# Patient Record
Sex: Female | Born: 1955 | Race: Black or African American | Hispanic: No | Marital: Single | Smoking: Never smoker
Health system: Southern US, Community
[De-identification: ages and names within clinical notes are randomized; demographics above are authoritative.]

## PROBLEM LIST (undated history)

## (undated) DIAGNOSIS — Z860101 Personal history of adenomatous and serrated colon polyps: Secondary | ICD-10-CM

## (undated) DIAGNOSIS — F419 Anxiety disorder, unspecified: Secondary | ICD-10-CM

## (undated) DIAGNOSIS — Z86010 Personal history of colonic polyps: Secondary | ICD-10-CM

## (undated) DIAGNOSIS — E049 Nontoxic goiter, unspecified: Secondary | ICD-10-CM

## (undated) DIAGNOSIS — R112 Nausea with vomiting, unspecified: Secondary | ICD-10-CM

## (undated) DIAGNOSIS — F329 Major depressive disorder, single episode, unspecified: Secondary | ICD-10-CM

## (undated) DIAGNOSIS — M4800 Spinal stenosis, site unspecified: Secondary | ICD-10-CM

## (undated) DIAGNOSIS — M1990 Unspecified osteoarthritis, unspecified site: Secondary | ICD-10-CM

## (undated) DIAGNOSIS — E785 Hyperlipidemia, unspecified: Secondary | ICD-10-CM

## (undated) DIAGNOSIS — I10 Essential (primary) hypertension: Secondary | ICD-10-CM

## (undated) DIAGNOSIS — K7689 Other specified diseases of liver: Secondary | ICD-10-CM

## (undated) DIAGNOSIS — F32A Depression, unspecified: Secondary | ICD-10-CM

## (undated) DIAGNOSIS — Z9889 Other specified postprocedural states: Secondary | ICD-10-CM

## (undated) DIAGNOSIS — K219 Gastro-esophageal reflux disease without esophagitis: Secondary | ICD-10-CM

## (undated) DIAGNOSIS — C801 Malignant (primary) neoplasm, unspecified: Secondary | ICD-10-CM

## (undated) HISTORY — DX: Spinal stenosis, site unspecified: M48.00

## (undated) HISTORY — DX: Personal history of adenomatous and serrated colon polyps: Z86.0101

## (undated) HISTORY — DX: Nontoxic goiter, unspecified: E04.9

## (undated) HISTORY — DX: Hyperlipidemia, unspecified: E78.5

## (undated) HISTORY — DX: Personal history of colonic polyps: Z86.010

## (undated) HISTORY — PX: OTHER SURGICAL HISTORY: SHX169

## (undated) HISTORY — DX: Other specified diseases of liver: K76.89

## (undated) HISTORY — PX: COLONOSCOPY: SHX174

## (undated) HISTORY — DX: Gastro-esophageal reflux disease without esophagitis: K21.9

## (undated) HISTORY — DX: Unspecified osteoarthritis, unspecified site: M19.90

---

## 1999-06-02 HISTORY — PX: TOTAL HIP ARTHROPLASTY: SHX124

## 2000-09-29 HISTORY — PX: ESOPHAGOGASTRODUODENOSCOPY: SHX1529

## 2001-01-07 HISTORY — PX: COLONOSCOPY: SHX174

## 2002-07-31 HISTORY — PX: KNEE SURGERY: SHX244

## 2003-06-02 DIAGNOSIS — C801 Malignant (primary) neoplasm, unspecified: Secondary | ICD-10-CM

## 2003-06-02 HISTORY — DX: Malignant (primary) neoplasm, unspecified: C80.1

## 2003-06-02 HISTORY — PX: ESOPHAGOGASTRODUODENOSCOPY: SHX1529

## 2003-12-14 HISTORY — PX: COLONOSCOPY W/ POLYPECTOMY: SHX1380

## 2004-12-09 ENCOUNTER — Ambulatory Visit: Payer: Self-pay | Admitting: General Practice

## 2009-11-21 ENCOUNTER — Ambulatory Visit (HOSPITAL_COMMUNITY): Admission: RE | Admit: 2009-11-21 | Discharge: 2009-11-21 | Payer: Self-pay | Admitting: Family Medicine

## 2010-07-02 ENCOUNTER — Other Ambulatory Visit (HOSPITAL_COMMUNITY): Payer: Self-pay | Admitting: *Deleted

## 2010-07-02 DIAGNOSIS — R1011 Right upper quadrant pain: Secondary | ICD-10-CM

## 2010-07-02 DIAGNOSIS — R1031 Right lower quadrant pain: Secondary | ICD-10-CM

## 2010-07-14 ENCOUNTER — Ambulatory Visit (HOSPITAL_COMMUNITY)
Admission: RE | Admit: 2010-07-14 | Discharge: 2010-07-14 | Disposition: A | Discharge status: Home / Self Care | Payer: Self-pay | Source: Ambulatory Visit | Attending: Family Medicine | Admitting: Family Medicine

## 2010-07-14 ENCOUNTER — Encounter (HOSPITAL_COMMUNITY): Payer: Self-pay

## 2010-07-14 DIAGNOSIS — M5137 Other intervertebral disc degeneration, lumbosacral region: Secondary | ICD-10-CM | POA: Insufficient documentation

## 2010-07-14 DIAGNOSIS — R1031 Right lower quadrant pain: Secondary | ICD-10-CM

## 2010-07-14 DIAGNOSIS — M51379 Other intervertebral disc degeneration, lumbosacral region without mention of lumbar back pain or lower extremity pain: Secondary | ICD-10-CM | POA: Insufficient documentation

## 2010-07-14 DIAGNOSIS — M48061 Spinal stenosis, lumbar region without neurogenic claudication: Secondary | ICD-10-CM | POA: Insufficient documentation

## 2010-07-14 DIAGNOSIS — R1011 Right upper quadrant pain: Secondary | ICD-10-CM | POA: Insufficient documentation

## 2010-07-14 DIAGNOSIS — R109 Unspecified abdominal pain: Secondary | ICD-10-CM | POA: Insufficient documentation

## 2010-07-14 DIAGNOSIS — Z96649 Presence of unspecified artificial hip joint: Secondary | ICD-10-CM | POA: Insufficient documentation

## 2010-07-14 MED ORDER — IOHEXOL 300 MG/ML  SOLN
100.0000 mL | Freq: Once | INTRAMUSCULAR | Status: AC | PRN
  Administered 2010-07-14: 100 mL via INTRAVENOUS

## 2010-09-15 ENCOUNTER — Other Ambulatory Visit (HOSPITAL_COMMUNITY): Payer: Self-pay | Admitting: Family Medicine

## 2010-09-15 DIAGNOSIS — R1031 Right lower quadrant pain: Secondary | ICD-10-CM

## 2010-09-15 DIAGNOSIS — R1011 Right upper quadrant pain: Secondary | ICD-10-CM

## 2010-09-30 ENCOUNTER — Other Ambulatory Visit (HOSPITAL_COMMUNITY): Payer: Self-pay | Admitting: Family Medicine

## 2010-09-30 DIAGNOSIS — Z139 Encounter for screening, unspecified: Secondary | ICD-10-CM

## 2010-10-20 ENCOUNTER — Ambulatory Visit (HOSPITAL_COMMUNITY)
Admission: RE | Admit: 2010-10-20 | Discharge: 2010-10-20 | Disposition: A | Discharge status: Home / Self Care | Payer: Self-pay | Source: Ambulatory Visit | Attending: Family Medicine | Admitting: Family Medicine

## 2010-10-20 DIAGNOSIS — Z139 Encounter for screening, unspecified: Secondary | ICD-10-CM

## 2011-06-02 HISTORY — PX: TOTAL HIP ARTHROPLASTY: SHX124

## 2011-07-11 LAB — COMPREHENSIVE METABOLIC PANEL
ALT: 24 U/L (ref 7–35)
Alkaline Phosphatase: 89 U/L
Calcium: 11.1 mg/dL
Creat: 0.72
TSH: 1.69 u[IU]/mL (ref 0.41–5.90)
Total Bilirubin: 1.4 mg/dL

## 2011-08-24 ENCOUNTER — Telehealth: Payer: Self-pay

## 2011-08-24 NOTE — Telephone Encounter (Signed)
Called pt to schedule appt for colonoscopy. ( Triaged by Ginger on Sat 08/22/2011). She said she is having problems with her stool. She said sometimes it will not come out. She also states that some stool comes out of her vagina. Scheduled for OV with Gerrit Halls, NP on 08/25/2011 at 11:30 AM.   Pt said she does not have insurance. She was given a paper at the event on Sat to call [employer] at [phone] to sign up for assistance. I spoke with my office manager, Ave Filter, who said it was OK to schedule now. Just advise pt to complete her paper work. Pt said she will do so.

## 2011-08-25 ENCOUNTER — Encounter: Payer: Self-pay | Admitting: Gastroenterology

## 2011-08-25 ENCOUNTER — Ambulatory Visit: Payer: Self-pay | Admitting: Gastroenterology

## 2011-08-25 ENCOUNTER — Ambulatory Visit (INDEPENDENT_AMBULATORY_CARE_PROVIDER_SITE_OTHER): Payer: Self-pay | Admitting: Gastroenterology

## 2011-08-25 ENCOUNTER — Other Ambulatory Visit: Payer: Self-pay

## 2011-08-25 VITALS — BP 120/76 | HR 69 | Temp 97.2°F | Ht 67.0 in | Wt 225.6 lb

## 2011-08-25 DIAGNOSIS — R1013 Epigastric pain: Secondary | ICD-10-CM | POA: Insufficient documentation

## 2011-08-25 DIAGNOSIS — K219 Gastro-esophageal reflux disease without esophagitis: Secondary | ICD-10-CM

## 2011-08-25 DIAGNOSIS — R6881 Early satiety: Secondary | ICD-10-CM | POA: Insufficient documentation

## 2011-08-25 DIAGNOSIS — Z86010 Personal history of colonic polyps: Secondary | ICD-10-CM

## 2011-08-25 DIAGNOSIS — R198 Other specified symptoms and signs involving the digestive system and abdomen: Secondary | ICD-10-CM | POA: Insufficient documentation

## 2011-08-25 NOTE — Progress Notes (Signed)
Faxed to PCP

## 2011-08-25 NOTE — Assessment & Plan Note (Signed)
H/O 1.5 cm size polyp at 25 cm with patchy white mucosa at the base, removed at time of TCS 2005. Do not have that particular pathology. 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 1 cm on a stalk. Due for surveillance colonoscopy at this time.   She c/o change in bowels with daily mushy stool, feels incomplete, intermittent brbpr, ?stool/air from vagina? No specific risk factors for development of fistula. Further evaluation at time of colonoscopy.   Retrieve copy of last labs done at PCP in 07/2011.

## 2011-08-25 NOTE — Progress Notes (Signed)
Addended by: Cherene Julian D on: 08/25/2011 02:37 PM   Modules accepted: Orders

## 2011-08-25 NOTE — Progress Notes (Signed)
Labs from [facility] were reviewed, done 07/07/2011. Total cholesterol 163, HDL 51, glucose 80, BUN 18, creatinine 0.72, sodium 143, potassium 4.1, albumin 4.6, calcium 11.1H, total bilirubin 1.4, alkaline phosphatase 89, AST 20, ALT 24, free T4-1.3, TSH 1.69, vitamin D, 25-OH total 31.   Would recommend rechecking serum calcium level to make sure not still elevated. If still elevated, she will need further testing.

## 2011-08-25 NOTE — Patient Instructions (Signed)
We have scheduled you for an upper endoscopy and colonoscopy. Please see separate instructions.  

## 2011-08-25 NOTE — Progress Notes (Signed)
LM for pt to call. Lab order faxed to [facility].  

## 2011-08-25 NOTE — Assessment & Plan Note (Signed)
C/O GERD, epigastric pain, early satiety. H/O large hh per patient. EGD in near future. Obtain lab work from PCP.

## 2011-08-25 NOTE — Progress Notes (Signed)
Primary Care Physician:  Trenton Founds, DO  Primary Gastroenterologist:  Jonette Eva, MD   Chief Complaint  Patient presents with  . Colonoscopy    HPI:  Ruth Hughes is a 56 y.o. female here to schedule colonoscopy. She was seen at our [facility] last week. She is due for surveillance colonoscopy given history of advanced polyps. She is currently uninsured and seeking disability, therefore came to our facility with hope of patient assistance.  She complains of change in stool over past couple of years. Stool texture like chocolate pudding. Has to continually wipe to get clean. Uses a lot of tissue paper. Takes over 30 minutes to have BM. Feels like never complete. Rare constipation but usually associated with brbpr.  Some urgency. She wears a pad because she soils her clothing. Sees stool on front of pad, feels gas pass in front and questions if coming from her vagina. Crampy abdominal pain and epigastric pain. C/O early satiety.  Chronic right sided flank pain/rib cage pain. Lots of tests since 2010, told it was musculoskeletal. Nausea. Heartburn, but doesn't take anything anymore. Heartburn twice per week. No swallowing issues. No weight loss.   Current Outpatient Prescriptions  Medication Sig Dispense Refill  . aspirin 81 MG tablet Take 81 mg by mouth daily.      . cetirizine (ZYRTEC) 10 MG tablet Take 10 mg by mouth daily.      . metoprolol (LOPRESSOR) 50 MG tablet Take 25 mg by mouth once. 1/2 of 25 mg daily      . NON FORMULARY Calcium 500 mg qd      . oxycodone (OXY-IR) 5 MG capsule Take 5 mg by mouth every 6 (six) hours as needed.      . simvastatin (ZOCOR) 20 MG tablet Take 20 mg by mouth every evening.      . triamcinolone ointment (KENALOG) 0.5 % Apply topically 2 (two) times daily.        Allergies as of 08/25/2011 - Review Complete 08/25/2011  Allergen Reaction Noted  . Bextra (valdecoxib) Nausea Only 08/24/2011  . Tramadol Nausea Only 08/24/2011     Past Medical History  Diagnosis Date  . GERD (gastroesophageal reflux disease)   . Hx of adenomatous colonic polyps   . Hyperlipidemia   . PVC (premature ventricular contraction)   . Goiter   . Chronic hip pain   . Chronic back pain     lumbar  . Osteoarthritis   . Hiatal hernia     Past Surgical History  Procedure Date  . Hip surgery 2001    right hip replacement  . Esophagogastroduodenoscopy 2005    hiatal hernia, GERD per patient  . Colonoscopy 12/14/2003    Dr. Patel-->1.5 cm size polyp with patchy whitish mucosa in sigmoid colon around base of polyp. Path unavailable.   . Knee surgery 03/04    right  . Colonoscopy 01/07/2001    hyperplastic polyp/tubulovillous adenoma with severe atypia  . Colonoscopy     per patient has additional one in 2006, 2007 and Flex sig in 2008. Told next colonoscopy in 2013.     Family History  Problem Relation Age of Onset  . Colon cancer Neg Hx   . Breast cancer Neg Hx     History   Social History  . Marital Status: Single    Spouse Name: N/A    Number of Children: 1  . Years of Education: N/A   Occupational History  . Geologist, engineering for  14 years        . 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 Smoking status: Never Smoker   . Smokeless tobacco: Not on file  . Alcohol Use: No  . Drug Use: No  . Sexually Active: Not on file   Other Topics Concern  . Not on file   Social History Narrative  . No narrative on file      ROS:  General: Negative for anorexia, weight loss, fever, chills, fatigue, weakness. Eyes: Negative for vision changes.  ENT: Negative for hoarseness, difficulty swallowing , nasal congestion. CV: Negative for chest pain, angina, palpitations, dyspnea on exertion, peripheral edema.  Respiratory: Negative for dyspnea at rest, dyspnea on exertion, cough, sputum, wheezing.  GI: See history of present illness. GU:  Negative for dysuria, hematuria, urinary incontinence, urinary frequency,  nocturnal urination.  MS: Chronic left hip, low back pain.  Derm: Negative for rash or itching.  Neuro: Negative for weakness, abnormal sensation, seizure, frequent headaches, memory loss, confusion.  Psych: Negative for anxiety, depression, suicidal ideation, hallucinations.  Endo: Negative for unusual weight change.  Heme: Negative for bruising or bleeding. Allergy: Negative for rash or hives.    Physical Examination:  BP 120/76  Pulse 69  Temp(Src) 97.2 F (36.2 C) (Temporal)  Ht 5\' 7"  (1.702 m)  Wt 225 lb 9.6 oz (102.331 kg)  BMI 35.33 kg/m2   General: Well-nourished, well-developed in no acute distress. Tearful. Head: Normocephalic, atraumatic.   Eyes: Conjunctiva pink, no icterus. Mouth: Oropharyngeal mucosa moist and pink , no lesions erythema or exudate. Neck: Supple without thyromegaly, masses, or lymphadenopathy.  Lungs: Clear to auscultation bilaterally.  Heart: Regular rate and rhythm, no murmurs rubs or gallops.  Abdomen: Bowel sounds are normal, epigastric tenderness, tenderness with palpation right lower ribcage margin, no CVA tenderness, nondistended, no hepatosplenomegaly or masses, no abdominal bruits or    hernia , no rebound or guarding.   Rectal: Defer to time of colonoscopy. Extremities: No lower extremity edema. No clubbing or deformities.  Neuro: Alert and oriented x 4 , grossly normal neurologically.  Skin: Warm and dry, no rash or jaundice.   Psych: Alert and cooperative, normal mood and affect.   Imaging Studies: CT A/P in 4//2012 unremarkable.

## 2011-08-26 ENCOUNTER — Telehealth: Payer: Self-pay | Admitting: Gastroenterology

## 2011-08-26 NOTE — Telephone Encounter (Signed)
Patient was returning DS call. Please call her back.

## 2011-08-27 NOTE — Telephone Encounter (Signed)
Pt will be going to have blood work done in the morning.

## 2011-08-28 ENCOUNTER — Other Ambulatory Visit: Payer: Self-pay | Admitting: Gastroenterology

## 2011-08-29 LAB — CALCIUM: Calcium: 10.6 mg/dL — ABNORMAL HIGH (ref 8.4–10.5)

## 2011-08-31 NOTE — Progress Notes (Signed)
Per Ginger, she called pt last week and informed to get the labs done. I called pt back. She has had it done already.

## 2011-08-31 NOTE — Progress Notes (Signed)
Pt informed

## 2011-08-31 NOTE — Progress Notes (Signed)
LMOM to call.

## 2011-09-02 ENCOUNTER — Encounter: Payer: Self-pay | Admitting: Gastroenterology

## 2011-09-02 NOTE — Progress Notes (Signed)
Quick Note:  Almost normal. F/U with PCP for repeat calcium level in few months. ______

## 2011-09-02 NOTE — Progress Notes (Signed)
Additional path received. Updated PSH.

## 2011-09-03 ENCOUNTER — Encounter (HOSPITAL_COMMUNITY)
Admission: RE | Disposition: A | Discharge status: Home / Self Care | Payer: Self-pay | Source: Ambulatory Visit | Attending: Gastroenterology

## 2011-09-03 ENCOUNTER — Ambulatory Visit (HOSPITAL_COMMUNITY)
Admission: RE | Admit: 2011-09-03 | Discharge: 2011-09-03 | Disposition: A | Discharge status: Home / Self Care | Payer: Self-pay | Source: Ambulatory Visit | Attending: Gastroenterology | Admitting: Gastroenterology

## 2011-09-03 ENCOUNTER — Encounter (HOSPITAL_COMMUNITY): Payer: Self-pay | Admitting: *Deleted

## 2011-09-03 DIAGNOSIS — Z860100 Personal history of colon polyps, unspecified: Secondary | ICD-10-CM | POA: Insufficient documentation

## 2011-09-03 DIAGNOSIS — R1013 Epigastric pain: Secondary | ICD-10-CM

## 2011-09-03 DIAGNOSIS — R109 Unspecified abdominal pain: Secondary | ICD-10-CM | POA: Insufficient documentation

## 2011-09-03 DIAGNOSIS — K219 Gastro-esophageal reflux disease without esophagitis: Secondary | ICD-10-CM

## 2011-09-03 DIAGNOSIS — R6881 Early satiety: Secondary | ICD-10-CM

## 2011-09-03 DIAGNOSIS — R197 Diarrhea, unspecified: Secondary | ICD-10-CM | POA: Insufficient documentation

## 2011-09-03 DIAGNOSIS — K2970 Gastritis, unspecified, without bleeding: Secondary | ICD-10-CM

## 2011-09-03 DIAGNOSIS — K5730 Diverticulosis of large intestine without perforation or abscess without bleeding: Secondary | ICD-10-CM

## 2011-09-03 DIAGNOSIS — K648 Other hemorrhoids: Secondary | ICD-10-CM | POA: Insufficient documentation

## 2011-09-03 DIAGNOSIS — D126 Benign neoplasm of colon, unspecified: Secondary | ICD-10-CM | POA: Insufficient documentation

## 2011-09-03 DIAGNOSIS — R198 Other specified symptoms and signs involving the digestive system and abdomen: Secondary | ICD-10-CM

## 2011-09-03 DIAGNOSIS — K2940 Chronic atrophic gastritis without bleeding: Secondary | ICD-10-CM | POA: Insufficient documentation

## 2011-09-03 DIAGNOSIS — K2990 Gastroduodenitis, unspecified, without bleeding: Secondary | ICD-10-CM

## 2011-09-03 HISTORY — PX: ESOPHAGOGASTRODUODENOSCOPY: SHX1529

## 2011-09-03 HISTORY — PX: COLONOSCOPY: SHX174

## 2011-09-03 SURGERY — COLONOSCOPY WITH ESOPHAGOGASTRODUODENOSCOPY (EGD)
Anesthesia: Moderate Sedation

## 2011-09-03 MED ORDER — OMEPRAZOLE 20 MG PO CPDR: DELAYED_RELEASE_CAPSULE | ORAL | Status: DC

## 2011-09-03 MED ORDER — MIDAZOLAM HCL 5 MG/5ML IJ SOLN
INTRAMUSCULAR | Status: AC
  Filled 2011-09-03: qty 10

## 2011-09-03 MED ORDER — MIDAZOLAM HCL 5 MG/5ML IJ SOLN
INTRAMUSCULAR | Status: DC | PRN
  Administered 2011-09-03 (×2): 1 mg via INTRAVENOUS
  Administered 2011-09-03 (×2): 2 mg via INTRAVENOUS
  Administered 2011-09-03: 1 mg via INTRAVENOUS

## 2011-09-03 MED ORDER — MEPERIDINE HCL 50 MG/ML IJ SOLN
INTRAMUSCULAR | Status: AC
  Filled 2011-09-03: qty 2

## 2011-09-03 MED ORDER — SODIUM CHLORIDE 0.45 % IV SOLN
Freq: Once | INTRAVENOUS | Status: AC
  Administered 2011-09-03: 10:00:00 via INTRAVENOUS

## 2011-09-03 MED ORDER — MEPERIDINE HCL 100 MG/ML IJ SOLN
INTRAMUSCULAR | Status: AC
  Filled 2011-09-03: qty 2

## 2011-09-03 MED ORDER — STERILE WATER FOR IRRIGATION IR SOLN
Status: DC | PRN
  Administered 2011-09-03: 11:00:00

## 2011-09-03 MED ORDER — MEPERIDINE HCL 100 MG/ML IJ SOLN
INTRAMUSCULAR | Status: DC | PRN
  Administered 2011-09-03 (×2): 25 mg via INTRAVENOUS
  Administered 2011-09-03: 50 mg via INTRAVENOUS

## 2011-09-03 NOTE — Discharge Instructions (Signed)
YOUR UPPER ENDOSCOPY SHOWED GASTRITIS DUE TO ASPIRIN USE. YOU HAD ONE POLYP REMOVED. NO OBVIOUS SOURCE FOR YOUR LOOSE STOOLS WERE IDENTIFIED. THEY ARE MOST LIKELY COMING FROM WHAT YOUR ARE EATING. You have internal hemorrhoids, WHICH CAN CAUSE RECTAL BLEEDING. I biopsied your stomach, SMALL BOWEL, & colon.  START PRILOSEC 30 MINUTES PRIOR TO YOUR FIRST MEAL.  AVOID ITEMS THAT TRIGGER GASTRITIS. SEE INFO BELOW.  FOLLOW A HIGH FIBER/LOW FAT/LACTOSE FREE DIET. AVOID ITEMS THAT CAUSE BLOATING. SEE INFO BELOW.  LOSE 10 TO 20 LBS. IT WILL DECREASE YOUR RISK FOR COLON CANCER.  YOUR BIOPSY RESULTS WILL BE BACK IN 7-10 DAYS.  FOLLOW UP IN 4 MOS.   ENDOSCOPY Care After Read the instructions outlined below and refer to this sheet in the next week. These discharge instructions provide you with general information on caring for yourself after you leave the hospital. While your treatment has been planned according to the most current medical practices available, unavoidable complications occasionally occur. If you have any problems or questions after discharge, call DR. Zooey Schreurs, [phone].  ACTIVITY  You may resume your regular activity, but move at a slower pace for the next 24 hours.   Take frequent rest periods for the next 24 hours.   Walking will help get rid of the air and reduce the bloated feeling in your belly (abdomen).   No driving for 24 hours (because of the medicine (anesthesia) used during the test).   You may shower.   Do not sign any important legal documents or operate any machinery for 24 hours (because of the anesthesia used during the test).    NUTRITION  Drink plenty of fluids.   You may resume your normal diet as instructed by your doctor.   Begin with a light meal and progress to your normal diet. Heavy or fried foods are harder to digest and may make you feel sick to your stomach (nauseated).   Avoid alcoholic beverages for 24 hours or as instructed.     MEDICATIONS  You may resume your normal medications.   WHAT YOU CAN EXPECT TODAY  Some feelings of bloating in the abdomen.   Passage of more gas than usual.   Spotting of blood in your stool or on the toilet paper  .  IF YOU HAD POLYPS REMOVED DURING THE ENDOSCOPY:  Eat a soft diet IF YOU HAVE NAUSEA, BLOATING, ABDOMINAL PAIN, OR VOMITING.    FINDING OUT THE RESULTS OF YOUR TEST Not all test results are available during your visit. DR. Darrick Penna WILL CALL YOU WITHIN 7 DAYS OF YOUR PROCEDUE WITH YOUR RESULTS. Do not assume everything is normal if you have not heard from DR. Hilery Wintle IN ONE WEEK, CALL HER OFFICE AT [phone].  SEEK IMMEDIATE MEDICAL ATTENTION AND CALL THE OFFICE: [phone] IF:  You have more than a spotting of blood in your stool.   Your belly is swollen (abdominal distention).   You are nauseated or vomiting.   You have a temperature over 101F.   You have abdominal pain or discomfort that is severe or gets worse throughout the day.  Gastritis  Gastritis is an inflammation (the body's way of reacting to injury and/or infection) of the stomach. It is often caused by viral or bacterial (germ) infections. It can also be caused BY ASPIRIN, BC/GOODY POWDER'S, (IBUPROFEN) MOTRIN, OR ALEVE (NAPROXEN), chemicals (including alcohol), SPICY FOODS, and medications. This illness may be associated with generalized malaise (feeling tired, not well), UPPER ABDOMINAL STOMACH cramps, and fever.  One common bacterial cause of gastritis is an organism known as H. Pylori. This can be treated with antibiotics.    Polyps, Colon  A polyp is extra tissue that grows inside your body. Colon polyps grow in the large intestine. The large intestine, also called the colon, is part of your digestive system. It is a long, hollow tube at the end of your digestive tract where your body makes and stores stool. Most polyps are not dangerous. They are benign. This means they are not  cancerous. But over time, some types of polyps can turn into cancer. Polyps that are smaller than a pea are usually not harmful. But larger polyps could someday become or may already be cancerous. To be safe, doctors remove all polyps and test them.   WHO GETS POLYPS? Anyone can get polyps, but certain people are more likely than others. You may have a greater chance of getting polyps if:  You are over 50.   You have had polyps before.   Someone in your family has had polyps.   Someone in your family has had cancer of the large intestine.   Find out if someone in your family has had polyps. You may also be more likely to get polyps if you:   Eat a lot of fatty foods   Smoke   Drink alcohol   Do not exercise  Eat too much   TREATMENT  The caregiver will remove the polyp during sigmoidoscopy or colonoscopy.  PREVENTION There is not one sure way to prevent polyps. You might be able to lower your risk of getting them if you:  Eat more fruits and vegetables and less fatty food.   Do not smoke.   Avoid alcohol.   Exercise every day.   Lose weight if you are overweight.   Eating more calcium and folate can also lower your risk of getting polyps. Some foods that are rich in calcium are milk, cheese, and broccoli. Some foods that are rich in folate are chickpeas, kidney beans, and spinach.    Lactose Free Diet Lactose is a carbohydrate that is found mainly in milk and milk products, as well as in foods with added milk or whey. Lactose must be digested by the enzyme in order to be used by the body. Lactose intolerance occurs when there is a shortage of lactase. When your body is not able to digest lactose, you may feel sick to your stomach (nausea), bloating, cramping, gas and diarrhea.  There are many dairy products that may be tolerated better than milk by some people:  The use of cultured dairy products such as yogurt, buttermilk, cottage cheese, and sweet acidophilus milk  (Kefir) for lactase-deficient individuals is usually well tolerated. This is because the healthy bacteria help digest lactose.   Lactose-hydrolyzed milk (Lactaid) contains 40-90% less lactose than milk and may also be well tolerated.    SPECIAL NOTES  Lactose is a carbohydrates. The major food source is dairy products. Reading food labels is important. Many products contain lactose even when they are not made from milk. Look for the following words: whey, milk solids, dry milk solids, nonfat dry milk powder. Typical sources of lactose other than dairy products include breads, candies, cold cuts, prepared and processed foods, and commercial sauces and gravies.   All foods must be prepared without milk, cream, or other dairy foods.   Soy milk and lactose-free supplements (LACTASE) may be used as an alternative to milk.   FOOD GROUP  ALLOWED/RECOMMENDED AVOID/USE SPARINGLY  BREADS / STARCHES 4 servings or more* Breads and rolls made without milk. [place], [place], or [place] bread. Breads and rolls that contain milk. Prepared mixes such as muffins, biscuits, waffles, pancakes. Sweet rolls, donuts, [place] toast (if made with milk or lactose).  Crackers: Soda crackers, graham crackers. Any crackers prepared without lactose. Zwieback crackers, corn curls, or any that contain lactose.  Cereals: Cooked or dry cereals prepared without lactose (read labels). Cooked or dry cereals prepared with lactose (read labels). Total, Cocoa Krispies. Special K.  Potatoes / Pasta / Rice: Any prepared without milk or lactose. Popcorn. Instant potatoes, frozen [place] fries, scalloped or au gratin potatoes.  VEGETABLES 2 servings or more Fresh, frozen, and canned vegetables. Creamed or breaded vegetables. Vegetables in a cheese sauce or with lactose-containing margarines.  FRUIT 2 servings or more All fresh, canned, or frozen fruits that are not processed with lactose. Any canned or frozen fruits processed with  lactose.  MEAT & SUBSTITUTES 2 servings or more (4 to 6 oz. total per day) Plain beef, chicken, fish, [place], lamb, veal, pork, or ham. Kosher prepared meat products. Strained or junior meats that do not contain milk. Eggs, soy meat substitutes, nuts. Scrambled eggs, omelets, and souffles that contain milk. Creamed or breaded meat, fish, or fowl. Sausage products such as wieners, liver sausage, or cold cuts that contain milk solids. Cheese, cottage cheese, or cheese spreads.  MILK None. (See "BEVERAGES" for milk substitutes. See "DESSERTS" for ice cream and frozen desserts.) Milk (whole, 2%, skim, or chocolate). Evaporated, powdered, or condensed milk; malted milk.  SOUPS & COMBINATION FOODS Bouillon, broth, vegetable soups, clear soups, consomms. Homemade soups made with allowed ingredients. Combination or prepared foods that do not contain milk or milk products (read labels). Cream soups, chowders, commercially prepared soups containing lactose. Macaroni and cheese, pizza. Combination or prepared foods that contain milk or milk products.  DESSERTS & SWEETS In moderation Water and fruit ices; gelatin; angel food cake. Homemade cookies, pies, or cakes made from allowed ingredients. Pudding (if made with water or a milk substitute). Lactose-free tofu desserts. Sugar, honey, corn syrup, jam, jelly; marmalade; molasses (beet sugar); Pure sugar candy; marshmallows. Ice cream, ice milk, sherbet, custard, pudding, frozen yogurt. Commercial cake and cookie mixes. Desserts that contain chocolate. Pie crust made with milk-containing margarine; reduced-calorie desserts made with a sugar substitute that contains lactose. Toffee, peppermint, butterscotch, chocolate, caramels.  FATS & OILS In moderation Butter (as tolerated; contains very small amounts of lactose). Margarines and dressings that do not contain milk, Vegetable oils, shortening, Miracle Whip, mayonnaise, nondairy cream & whipped toppings without lactose  or milk solids added (examples: Coffee Rich, Carnation Coffeemate, Rich's Whipped Topping, PolyRich). Tomasa Blase. Margarines and salad dressings containing milk; cream, cream cheese; peanut butter with added milk solids, sour cream, chip dips, made with sour cream.  BEVERAGES Carbonated drinks; tea; coffee and freeze-dried coffee; some instant coffees (check labels). Fruit drinks; fruit and vegetable juice; Rice or Soy milk. Ovaltine, hot chocolate. Some cocoas; some instant coffees; instant iced teas; powdered fruit drinks (read labels).   CONDIMENTS / MISCELLANEOUS Soy sauce, carob powder, olives, gravy made with water, baker's cocoa, pickles, pure seasonings and spices, wine, pure monosodium glutamate, catsup, mustard. Some chewing gums, chocolate, some cocoas. Certain antibiotics and vitamin / mineral preparations. Spice blends if they contain milk products. MSG extender. Artificial sweeteners that contain lactose such as Equal (Nutra-Sweet) and Sweet 'n Low. Some nondairy creamers (read labels).   SAMPLE MENU*  Breakfast   Orange Juice.  Banana.   Bran flakes.   Nondairy Creamer.  [place]ienna Bread (toasted).   Butter or milk-free margarine.   Coffee or tea.    Noon Meal   Chicken Breast.  Rice.   Green beans.   Butter or milk-free margarine.  Fresh melon.   Coffee or tea.    Evening Meal   Roast Beef.  Baked potato.   Butter or milk-free margarine.   Broccoli.   Lettuce salad with vinegar and oil dressing.  [employer].   Coffee or tea.      HIGH FIBER DIET A high-fiber diet changes your normal diet to include more whole grains, legumes, fruits, and vegetables. Changes in the diet involve replacing refined carbohydrates with unrefined foods. The calorie level of the diet is essentially unchanged. The Dietary Reference Intake (recommended amount) for adult males is 38 grams per day. For adult females, it is 25 grams per day. Pregnant and lactating women  should consume 28 grams of fiber per day. Fiber is the intact part of a plant that is not broken down during digestion. Functional fiber is fiber that has been isolated from the plant to provide a beneficial effect in the body. PURPOSE  Increase stool bulk.   Ease and regulate bowel movements.   Lower cholesterol.  INDICATIONS THAT YOU NEED MORE FIBER  Constipation and hemorrhoids.   Uncomplicated diverticulosis (intestine condition) and irritable bowel syndrome.   Weight management.   As a protective measure against hardening of the arteries (atherosclerosis), diabetes, and cancer.   GUIDELINES FOR INCREASING FIBER IN THE DIET  Start adding fiber to the diet slowly. A gradual increase of about 5 more grams (2 slices of whole-wheat bread, 2 servings of most fruits or vegetables, or 1 bowl of high-fiber cereal) per day is best. Too rapid an increase in fiber may result in constipation, flatulence, and bloating.   Drink enough water and fluids to keep your urine clear or pale yellow. Water, juice, or caffeine-free drinks are recommended. Not drinking enough fluid may cause constipation.   Eat a variety of high-fiber foods rather than one type of fiber.   Try to increase your intake of fiber through using high-fiber foods rather than fiber pills or supplements that contain small amounts of fiber.   The goal is to change the types of food eaten. Do not supplement your present diet with high-fiber foods, but replace foods in your present diet.  INCLUDE A VARIETY OF FIBER SOURCES  Replace refined and processed grains with whole grains, canned fruits with fresh fruits, and incorporate other fiber sources. White rice, white breads, and most bakery goods contain little or no fiber.   Brown whole-grain rice, buckwheat oats, and many fruits and vegetables are all good sources of fiber. These include: broccoli, Brussels sprouts, cabbage, cauliflower, beets, sweet potatoes, white potatoes (skin  on), carrots, tomatoes, eggplant, squash, berries, fresh fruits, and dried fruits.   Cereals appear to be the richest source of fiber. Cereal fiber is found in whole grains and bran. Bran is the fiber-rich outer coat of cereal grain, which is largely removed in refining. In whole-grain cereals, the bran remains. In breakfast cereals, the largest amount of fiber is found in those with "bran" in their names. The fiber content is sometimes indicated on the label.   You may need to include additional fruits and vegetables each day.   In baking, for 1 cup white flour, you may use the following  substitutions:   1 cup whole-wheat flour minus 2 tablespoons.   1/2 cup white flour plus 1/2 cup whole-wheat flour.   Low-Fat Diet BREADS, CEREALS, PASTA, RICE, DRIED PEAS, AND BEANS These products are high in carbohydrates and most are low in fat. Therefore, they can be increased in the diet as substitutes for fatty foods. They too, however, contain calories and should not be eaten in excess. Cereals can be eaten for snacks as well as for breakfast.  Include foods that contain fiber (fruits, vegetables, whole grains, and legumes). Research shows that fiber may lower blood cholesterol levels, especially the water-soluble fiber found in fruits, vegetables, oat products, and legumes. FRUITS AND VEGETABLES It is good to eat fruits and vegetables. Besides being sources of fiber, both are rich in vitamins and some minerals. They help you get the daily allowances of these nutrients. Fruits and vegetables can be used for snacks and desserts. MEATS Limit lean meat, chicken, [place], and fish to no more than 6 ounces per day. Beef, Pork, and Lamb Use lean cuts of beef, pork, and lamb. Lean cuts include:  Extra-lean ground beef.  Arm roast.  Sirloin tip.  Center-cut ham.  Round steak.  Loin chops.  Rump roast.  Tenderloin.  Trim all fat off the outside of meats before cooking. It is not necessary to severely  decrease the intake of red meat, but lean choices should be made. Lean meat is rich in protein and contains a highly absorbable form of iron. Premenopausal women, in particular, should avoid reducing lean red meat because this could increase the risk for low red blood cells (iron-deficiency anemia). The organ meats, such as liver, sweetbreads, kidneys, and brain are very rich in cholesterol. They should be limited. Chicken and [place] These are good sources of protein. The fat of poultry can be reduced by removing the skin and underlying fat layers before cooking. Chicken and [place] can be substituted for lean red meat in the diet. Poultry should not be fried or covered with high-fat sauces. Fish and Shellfish Fish is a good source of protein. Shellfish contain cholesterol, but they usually are low in saturated fatty acids. The preparation of fish is important. Like chicken and [place], they should not be fried or covered with high-fat sauces. EGGS Egg whites contain no fat or cholesterol. They can be eaten often. Try 1 to 2 egg whites instead of whole eggs in recipes or use egg substitutes that do not contain yolk. MILK AND DAIRY PRODUCTS Use skim or 1% milk instead of 2% or whole milk. Decrease whole milk, natural, and processed cheeses. Use nonfat or low-fat (2%) cottage cheese or low-fat cheeses made from vegetable oils. Choose nonfat or low-fat (1 to 2%) yogurt. Experiment with evaporated skim milk in recipes that call for heavy cream. Substitute low-fat yogurt or low-fat cottage cheese for sour cream in dips and salad dressings. Have at least 2 servings of low-fat dairy products, such as 2 glasses of skim (or 1%) milk each day to help get your daily calcium intake.  FATS AND OILS Reduce the total intake of fats, especially saturated fat. Butterfat, lard, and beef fats are high in saturated fat and cholesterol. These should be avoided as much as possible. Vegetable fats do not contain cholesterol, but  certain vegetable fats, such as coconut oil, palm oil, and palm kernel oil are very high in saturated fats. These should be limited. These fats are often used in [employer], processed foods, popcorn, oils, and nondairy  creamers. Vegetable shortenings and some peanut butters contain hydrogenated oils, which are also saturated fats. Read the labels on these foods and check for saturated vegetable oils. Unsaturated vegetable oils and fats do not raise blood cholesterol. However, they should be limited because they are fats and are high in calories. Total fat should still be limited to 30% of your daily caloric intake. Desirable liquid vegetable oils are corn oil, cottonseed oil, olive oil, canola oil, safflower oil, soybean oil, and sunflower oil. Peanut oil is not as good, but small amounts are acceptable. Buy a heart-healthy tub margarine that has no partially hydrogenated oils in the ingredients. Mayonnaise and salad dressings often are made from unsaturated fats, but they should also be limited because of their high calorie and fat content. Seeds, nuts, peanut butter, olives, and avocados are high in fat, but the fat is mainly the unsaturated type. These foods should be limited mainly to avoid excess calories and fat. OTHER EATING TIPS Snacks  Most sweets should be limited as snacks. They tend to be rich in calories and fats, and their caloric content outweighs their nutritional value. Some good choices in snacks are graham crackers, melba toast, soda crackers, bagels (no egg), English muffins, fruits, and vegetables. These snacks are preferable to snack crackers, [place] fries, and chips. Popcorn should be air-popped or cooked in small amounts of liquid vegetable oil. Desserts Eat fruit, low-fat yogurt, and fruit ices. AVOID pastries, cake, and cookies. Sherbet, angel food cake, gelatin dessert, frozen low-fat yogurt, or other frozen products that do not contain saturated fat (pure fruit juice bars, frozen  ice pops) are also acceptable.  COOKING METHODS Choose those methods that use little or no fat. They include: Poaching.  Braising.  Steaming.  Grilling.  Baking.  Stir-frying.  Broiling.  Microwaving.  Foods can be cooked in a nonstick pan without added fat, or use a nonfat cooking spray in regular cookware. Limit fried foods and avoid frying in saturated fat. Add moisture to lean meats by using water, broth, cooking wines, and other nonfat or low-fat sauces along with the cooking methods mentioned above. Soups and stews should be chilled after cooking. The fat that forms on top after a few hours in the refrigerator should be skimmed off. When preparing meals, avoid using excess salt. Salt can contribute to raising blood pressure in some people. EATING AWAY FROM HOME Order entres, potatoes, and vegetables without sauces or butter. When meat exceeds the size of a deck of cards (3 to 4 ounces), the rest can be taken home for another meal. Choose vegetable or fruit salads and ask for low-calorie salad dressings to be served on the side. Use dressings sparingly. Limit high-fat toppings, such as bacon, crumbled eggs, cheese, sunflower seeds, and olives. Ask for heart-healthy tub margarine instead of butter.  Hemorrhoids Hemorrhoids are dilated (enlarged) veins around the rectum. Sometimes clots will form in the veins. This makes them swollen and painful. These are called thrombosed hemorrhoids. Causes of hemorrhoids include:  Constipation.   Straining to have a bowel movement.   HEAVY LIFTING HOME CARE INSTRUCTIONS  Eat a well balanced diet and drink 6 to 8 glasses of water every day to avoid constipation. You may also use a bulk laxative.   Avoid straining to have bowel movements.   Keep anal area dry and clean.   Do not use a donut shaped pillow or sit on the toilet for long periods. This increases blood pooling and pain.   Move your  bowels when your body has the urge; this will  require less straining and will decrease pain and pressure.

## 2011-09-03 NOTE — H&P (View-Only) (Signed)
Primary Care Physician:  Trenton Founds, DO  Primary Gastroenterologist:  Jonette Eva, MD   Chief Complaint  Patient presents with  . Colonoscopy    HPI:  Ruth Hughes is a 56 y.o. female here to schedule colonoscopy. She was seen at our [facility] last week. She is due for surveillance colonoscopy given history of advanced polyps. She is currently uninsured and seeking disability, therefore came to our facility with hope of patient assistance.  She complains of change in stool over past couple of years. Stool texture like chocolate pudding. Has to continually wipe to get clean. Uses a lot of tissue paper. Takes over 30 minutes to have BM. Feels like never complete. Rare constipation but usually associated with brbpr.  Some urgency. She wears a pad because she soils her clothing. Sees stool on front of pad, feels gas pass in front and questions if coming from her vagina. Crampy abdominal pain and epigastric pain. C/O early satiety.  Chronic right sided flank pain/rib cage pain. Lots of tests since 2010, told it was musculoskeletal. Nausea. Heartburn, but doesn't take anything anymore. Heartburn twice per week. No swallowing issues. No weight loss.   Current Outpatient Prescriptions  Medication Sig Dispense Refill  . aspirin 81 MG tablet Take 81 mg by mouth daily.      . cetirizine (ZYRTEC) 10 MG tablet Take 10 mg by mouth daily.      . metoprolol (LOPRESSOR) 50 MG tablet Take 25 mg by mouth once. 1/2 of 25 mg daily      . NON FORMULARY Calcium 500 mg qd      . oxycodone (OXY-IR) 5 MG capsule Take 5 mg by mouth every 6 (six) hours as needed.      . simvastatin (ZOCOR) 20 MG tablet Take 20 mg by mouth every evening.      . triamcinolone ointment (KENALOG) 0.5 % Apply topically 2 (two) times daily.        Allergies as of 08/25/2011 - Review Complete 08/25/2011  Allergen Reaction Noted  . Bextra (valdecoxib) Nausea Only 08/24/2011  . Tramadol Nausea Only 08/24/2011     Past Medical History  Diagnosis Date  . GERD (gastroesophageal reflux disease)   . Hx of adenomatous colonic polyps   . Hyperlipidemia   . PVC (premature ventricular contraction)   . Goiter   . Chronic hip pain   . Chronic back pain     lumbar  . Osteoarthritis   . Hiatal hernia     Past Surgical History  Procedure Date  . Hip surgery 2001    right hip replacement  . Esophagogastroduodenoscopy 2005    hiatal hernia, GERD per patient  . Colonoscopy 12/14/2003    Dr. Patel-->1.5 cm size polyp with patchy whitish mucosa in sigmoid colon around base of polyp. Path unavailable.   . Knee surgery 03/04    right  . Colonoscopy 01/07/2001    hyperplastic polyp/tubulovillous adenoma with severe atypia  . Colonoscopy     per patient has additional one in 2006, 2007 and Flex sig in 2008. Told next colonoscopy in 2013.     Family History  Problem Relation Age of Onset  . Colon cancer Neg Hx   . Breast cancer Neg Hx     History   Social History  . Marital Status: Single    Spouse Name: N/A    Number of Children: 1  . Years of Education: N/A   Occupational History  . Geologist, engineering for  14 years        . 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 Smoking status: Never Smoker   . Smokeless tobacco: Not on file  . Alcohol Use: No  . Drug Use: No  . Sexually Active: Not on file   Other Topics Concern  . Not on file   Social History Narrative  . No narrative on file      ROS:  General: Negative for anorexia, weight loss, fever, chills, fatigue, weakness. Eyes: Negative for vision changes.  ENT: Negative for hoarseness, difficulty swallowing , nasal congestion. CV: Negative for chest pain, angina, palpitations, dyspnea on exertion, peripheral edema.  Respiratory: Negative for dyspnea at rest, dyspnea on exertion, cough, sputum, wheezing.  GI: See history of present illness. GU:  Negative for dysuria, hematuria, urinary incontinence, urinary frequency,  nocturnal urination.  MS: Chronic left hip, low back pain.  Derm: Negative for rash or itching.  Neuro: Negative for weakness, abnormal sensation, seizure, frequent headaches, memory loss, confusion.  Psych: Negative for anxiety, depression, suicidal ideation, hallucinations.  Endo: Negative for unusual weight change.  Heme: Negative for bruising or bleeding. Allergy: Negative for rash or hives.    Physical Examination:  BP 120/76  Pulse 69  Temp(Src) 97.2 F (36.2 C) (Temporal)  Ht 5\' 7"  (1.702 m)  Wt 225 lb 9.6 oz (102.331 kg)  BMI 35.33 kg/m2   General: Well-nourished, well-developed in no acute distress. Tearful. Head: Normocephalic, atraumatic.   Eyes: Conjunctiva pink, no icterus. Mouth: Oropharyngeal mucosa moist and pink , no lesions erythema or exudate. Neck: Supple without thyromegaly, masses, or lymphadenopathy.  Lungs: Clear to auscultation bilaterally.  Heart: Regular rate and rhythm, no murmurs rubs or gallops.  Abdomen: Bowel sounds are normal, epigastric tenderness, tenderness with palpation right lower ribcage margin, no CVA tenderness, nondistended, no hepatosplenomegaly or masses, no abdominal bruits or    hernia , no rebound or guarding.   Rectal: Defer to time of colonoscopy. Extremities: No lower extremity edema. No clubbing or deformities.  Neuro: Alert and oriented x 4 , grossly normal neurologically.  Skin: Warm and dry, no rash or jaundice.   Psych: Alert and cooperative, normal mood and affect.   Imaging Studies: CT A/P in 4//2012 unremarkable.

## 2011-09-03 NOTE — Progress Notes (Signed)
Quick Note:  Pt's daughter Alcario Drought, said pt is at the hospital having a procedure. Mailed letter for her to follow up with PCP on the Calcium in a few months. ______

## 2011-09-03 NOTE — Interval H&P Note (Signed)
History and Physical Interval Note:  09/03/2011 10:33 AM  Ruth Hughes  has presented today for surgery, with the diagnosis of ABD PAIN CHANGE IN BOWELS, GERD  The various methods of treatment have been discussed with the patient and family. After consideration of risks, benefits and other options for treatment, the patient has consented to  Procedure(s) (LRB): COLONOSCOPY WITH ESOPHAGOGASTRODUODENOSCOPY (EGD) (N/A) as a surgical intervention .  The patients' history has been reviewed, patient examined, no change in status, stable for surgery.  I have reviewed the patients' chart and labs.  Questions were answered to the patient's satisfaction.     [employer]

## 2011-09-03 NOTE — Op Note (Addendum)
[facility] [address] [place], [place]  [address]  COLONOSCOPY PROCEDURE REPORT  PATIENT:  Ruth Hughes, Ruth Hughes  MR#:  [id number] BIRTHDATE:  09-26-1955, 56 yrs. old  GENDER:  female  ENDOSCOPIST:  Jonette Eva, MD REF. BY:  DR. Lewis Moccasin, ANITA ASSISTANT:  PROCEDURE DATE:  09/03/2011 PROCEDURE:  ILEOColonoscopy with COLD FORCEPS BIOPSY/snare polypectomy, SPOT TATTOO, EGD WITH BIOPSY  INDICATIONS:  PERSONAL HX: POLYPS MORE FREQUENT LOOSE STOOLS, & ABDOMINAL PAIN  MEDICATIONS:   Demerol 100 mg IV, Versed 7 mg IV  DESCRIPTION OF PROCEDURE:    Physical exam was performed. Informed consent was obtained from the patient after explaining the benefits, risks, and alternatives to procedure.  The patient was connected to monitor and placed in left lateral position. Continuous oxygen was provided by nasal cannula and IV medicine administered through an indwelling cannula.  After administration of sedation and rectal exam, the patient's rectum was intubated and the EC-3890li ([id number]) and EG-2990i ([id number]) colonoscope was advanced under direct visualization to the ILEUM. The scope was removed slowly by carefully examining the color, texture, anatomy, and integrity mucosa on the way out.  After  THE COLONOSCOPY, the patient's rectum was intubated and the EC-3890li ([id number]) and EG-2990i ([id number]) colonoscope was advanced under direct visualization to the SECOND PORTION OF TH DUODENUM. The scope was removed slowly by carefully examining the color, texture, anatomy, and integrity mucosa on the way out.  The patient was recovered in endoscopy and discharged home in satisfactory condition. <<PROCEDUREIMAGES>>  FINDINGS:  There wAS ONE 8 MM SESSILE polyp identified and removed. in the descending colon VIA SNARE CAUTERY. BASE OF POLYP SITE TATTOOED WITH SPOT. RANDOM BIOPSIES OBTAINED VIA COLD FORCEPS TO EVALUATE FOR MICROSCOPIC COLITIS.  FREQUENT Diverticula were found in the left  colon.  SMALL Internal Hemorrhoids were found. MILD GASTRITIS BIOPSIED VIA COLD FORCEPS. NL ESOPHAGUS AND DUODENUM BIOPSIES OBTAINED VIA COLD FORCEPS.  PREP QUALITY: EXCELLENT CECAL W/D TIME:    16 minutes  COMPLICATIONS:    None  ENDOSCOPIC IMPRESSION: 1) Polyp in the descending colon 2) DiverticulOSIS in the left colon 3) Internal hemorrhoids 4) MILD GASTRITIS  RECOMMENDATIONS: AWAIT BIOPSIES OMP DAILY HOLD NSAIDS LOSE WEIGHT AWAIT BIOPSIES LOW FAT/HIGH FIBER/LACTOSE FREE DIET OPV IN 4 MOS  REPEAT EXAM:  No  ______________________________ Jonette Eva, MD  CC:  n. REVISED:  09/17/2011 12:53 PM eSIGNED:   Duncan Dull Yosselin Hughes at 09/17/2011 12:53 PM  Page 3 of 3   Ruth Hughes, [phone]

## 2011-09-03 NOTE — Progress Notes (Signed)
Results Cc to PCP  

## 2011-09-13 ENCOUNTER — Telehealth: Payer: Self-pay | Admitting: Gastroenterology

## 2011-09-13 NOTE — Telephone Encounter (Signed)
Please call pt. She had simple adenomas removed from her colon. HER stomach Bx shows gastritis DUE TO USING ASPIRIN PRODUCTS. Continue OMEPRAZOLE- 30 MINUTES PRIOR TO MEALS. FOLLOW A HIGH FIBER/LOW FTA/LACTOSE FREE DIET. OPV IN AUG 2013. TCS IN 10 YEARS.

## 2011-09-14 NOTE — Telephone Encounter (Signed)
Results Cc to PCP & Reminder is in the computer 

## 2011-09-14 NOTE — Telephone Encounter (Signed)
LMOM to call.

## 2011-09-14 NOTE — Telephone Encounter (Signed)
Pt was informed.

## 2011-09-24 NOTE — Progress Notes (Signed)
EGD GASTRITIS NL DUO Bx, TCS SIMPLE ADENOMA-NL COLON Bx  REVIEWED.

## 2011-12-30 ENCOUNTER — Encounter: Payer: Self-pay | Admitting: Gastroenterology

## 2011-12-31 ENCOUNTER — Ambulatory Visit (INDEPENDENT_AMBULATORY_CARE_PROVIDER_SITE_OTHER): Payer: Self-pay | Admitting: Gastroenterology

## 2011-12-31 ENCOUNTER — Encounter: Payer: Self-pay | Admitting: Gastroenterology

## 2011-12-31 VITALS — BP 127/75 | HR 65 | Temp 98.5°F | Ht 67.0 in | Wt 217.4 lb

## 2011-12-31 DIAGNOSIS — K2970 Gastritis, unspecified, without bleeding: Secondary | ICD-10-CM

## 2011-12-31 DIAGNOSIS — Z86010 Personal history of colonic polyps: Secondary | ICD-10-CM

## 2011-12-31 DIAGNOSIS — K219 Gastro-esophageal reflux disease without esophagitis: Secondary | ICD-10-CM

## 2011-12-31 NOTE — Progress Notes (Signed)
Called and informed pt. Routing to [employer] to nic.

## 2011-12-31 NOTE — Assessment & Plan Note (Signed)
Overall she is doing much better. Her abdominal pain, nausea has resolved. Her bowel function has returned to normal. Would recommend she continue omeprazole every other day as she is doing. We provided her with #60 Prilosec OTC samples to help her given her financial situation. Office visit when necessary.

## 2011-12-31 NOTE — Patient Instructions (Addendum)
Continue omeprazole as before. We have provided you with some Prilosec OTC samples to help you given your financial difficulties.   Please call [facility] to make your appointment as discussed. You should not need a referral. The number is [phone].

## 2011-12-31 NOTE — Assessment & Plan Note (Signed)
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 Last colonoscopy 2013, simple tubular adenoma. I will discuss with Dr. Darrick Penna with regards to when she liked to pursue the next colonoscopy. According to the operative note it says 10 year followup, but given history of tubulovillous adenoma I will discuss this with her.

## 2011-12-31 NOTE — Progress Notes (Signed)
Discussed with Dr. Darrick Penna. Her next TCS should be in 08/2016 NOT 2023. Please change NIC accordingly.  Please let pt know.

## 2011-12-31 NOTE — Progress Notes (Signed)
Changed to 5 year recall from 10

## 2011-12-31 NOTE — Progress Notes (Signed)
Primary Care Physician: Theodora Blow, FNP  Primary Gastroenterologist:  Jonette Eva, MD   Chief Complaint  Patient presents with  . Follow-up    HPI: Ruth Hughes is a 56 y.o. female here for four month f/u. Last in the office on 08/25/2011. She has a history of advanced polyps as outlined previously. Recent colonoscopy showed small tubular adenoma. For history of diarrhea she also had random colon biopsies obtained which were negative for microscopic colitis. Small bowel biopsy negative for celiac disease. She had mild gastritis felt to be due to aspirin.  Patient states that she's doing very well with regards to her bowel movements abdominal pain. The omeprazole seems to be helping. Stools were more formed. Nausea and heartburn have improved as well. Due to to financial concerns she is taking her omeprazole every other day only. This seems to be working for her. I offered to try to set her up for patient assistance program with a different PPI but she does not want to pursue this at this time. Previously tried Nexium and didn't like it.  Her biggest complaints today are those of musculoskeletal. Recently saw a rheumatologist and she reports he is referring her to endocrinologist for persistently elevated calcium level. She is pleased that she's been accepted for patient assistance through the [facility] program will be over to see the rheumatologist, endocrinologist, orthopedics at their facilities. She has chronic hip pain and needs a hip replacement. She tries to limit her oxycodone use because of expense.  Current Outpatient Prescriptions  Medication Sig Dispense Refill  . aspirin 81 MG tablet Take 81 mg by mouth daily.      . cetirizine (ZYRTEC) 10 MG tablet Take 10 mg by mouth daily.      . metoprolol (LOPRESSOR) 50 MG tablet Take 25 mg by mouth 2 (two) times daily.       Marland Kitchen omeprazole (PRILOSEC) 20 MG capsule 1 po every morning 30 minutes prior to your first meal.  31 capsule  11  .  oxycodone (OXY-IR) 5 MG capsule Take 5 mg by mouth every 6 (six) hours as needed. For pain      . simvastatin (ZOCOR) 20 MG tablet Take 20 mg by mouth every evening.      . triamcinolone ointment (KENALOG) 0.5 % Apply 1 application topically 2 (two) times daily.         Allergies as of 12/31/2011 - Review Complete 12/31/2011  Allergen Reaction Noted  . Bextra (valdecoxib) Nausea Only 08/24/2011  . Tramadol Nausea Only 08/24/2011    ROS:  General: Negative for anorexia, weight loss, fever, chills, fatigue, weakness. ENT: Negative for hoarseness, difficulty swallowing , nasal congestion. CV: Negative for chest pain, angina, palpitations, dyspnea on exertion, peripheral edema.  Respiratory: Negative for dyspnea at rest, dyspnea on exertion, cough, sputum, wheezing.  GI: See history of present illness. GU:  Negative for dysuria, hematuria, urinary incontinence, urinary frequency, nocturnal urination.  Endo: Negative for unusual weight change.  Musculoskeletal: chronic hip and joint pain.   Physical Examination:   BP 127/75  Pulse 65  Temp 98.5 F (36.9 C) (Temporal)  Ht 5\' 7"  (1.702 m)  Wt 217 lb 6.4 oz (98.612 kg)  BMI 34.05 kg/m2  General: Well-nourished, well-developed in no acute distress.  Eyes: No icterus. Mouth: Oropharyngeal mucosa moist and pink , no lesions erythema or exudate. Lungs: Clear to auscultation bilaterally.  Heart: Regular rate and rhythm, no murmurs rubs or gallops.  Abdomen: Bowel sounds are normal,  nontender, nondistended, no hepatosplenomegaly or masses, no abdominal bruits or hernia , no rebound or guarding.   Extremities: No lower extremity edema. No clubbing or deformities. Neuro: Alert and oriented x 4   Skin: Warm and dry, no jaundice.   Psych: Alert and cooperative, normal mood and affect.

## 2011-12-31 NOTE — Progress Notes (Signed)
Faxed to PCP

## 2012-01-11 ENCOUNTER — Ambulatory Visit: Payer: Self-pay | Admitting: Gastroenterology

## 2012-02-17 NOTE — Progress Notes (Signed)
REVIEWED. AGREE. 

## 2012-03-02 ENCOUNTER — Encounter: Payer: Self-pay | Admitting: *Deleted

## 2012-03-02 ENCOUNTER — Encounter: Payer: Self-pay | Admitting: Cardiology

## 2012-03-02 ENCOUNTER — Ambulatory Visit (INDEPENDENT_AMBULATORY_CARE_PROVIDER_SITE_OTHER): Payer: Medicaid Other | Admitting: Physician Assistant

## 2012-03-02 VITALS — BP 118/80 | HR 66 | Ht 67.0 in | Wt 212.0 lb

## 2012-03-02 DIAGNOSIS — M1990 Unspecified osteoarthritis, unspecified site: Secondary | ICD-10-CM | POA: Insufficient documentation

## 2012-03-02 DIAGNOSIS — Z01818 Encounter for other preprocedural examination: Secondary | ICD-10-CM

## 2012-03-02 DIAGNOSIS — E049 Nontoxic goiter, unspecified: Secondary | ICD-10-CM | POA: Insufficient documentation

## 2012-03-02 DIAGNOSIS — Z87898 Personal history of other specified conditions: Secondary | ICD-10-CM

## 2012-03-02 DIAGNOSIS — E785 Hyperlipidemia, unspecified: Secondary | ICD-10-CM | POA: Insufficient documentation

## 2012-03-02 DIAGNOSIS — M4800 Spinal stenosis, site unspecified: Secondary | ICD-10-CM | POA: Insufficient documentation

## 2012-03-02 NOTE — Progress Notes (Signed)
Name: Ruth Hughes    DOB: Apr 07, 1956  Age: 56 y.o. MR#: [id number]       PCP:  Theodora Blow, FNP       CC:   Preoperative cardiac clearance History of present illness: Ruth Hughes is a 56 year old  female patient who is referred to from [facility] for cardiac clearance before undergoing hip surgery by Dr. Charna Elizabeth in [place], [place]. Last year she was admitted to [facility] with what she describes as pins and needles sticking in her chest. She was hospitalized for 2 days and had a stress test which she says was normal. She was placed on beta blocker and simvastatin. She was recently told to stop her simvastatin and substitute fish oil.  She has a history of hypertension which is treated. She has no history of diabetes and has never smoked. Her father had an MI but was severe diabetic with kidney disease her mother had 2 CVAs.  Natural menopause occurred just a few years ago  Records obtained from [facility] and reviewed.  Admitted in 01/2011 with chest pain; Cardiologist-Dr. Yehuda Budd; normal EKG ex PVCs ;negative cardiac markers; mild hypertension, normal echo and stress echo.  The patient denies any chest pain including tightness, pressure, heaviness or radiation of pain. She denies dyspnea, dyspnea on exertion, palpitations, dizziness, or presyncope. She is unable to exercise much because of her hip pain and walks with a walker. She is scheduled to see an endocrinologist next week for goiter and she is followed at [facility] for rheumatologic disease.  BP 118/80  Pulse 66  Ht 5\' 7"  (1.702 m)  Wt 212 lb (96.163 kg)  BMI 33.20 kg/m2  SpO2 100%  Weights Current Weight  03/02/12 212 lb (96.163 kg)  12/31/11 217 lb 6.4 oz (98.612 kg)  09/03/11 225 lb (102.059 kg)   Blood Pressure  BP Readings from Last 3 Encounters:  03/02/12 118/80  12/31/11 127/75  09/03/11 128/65    Last encounter with RMR:  New patient.  The things    Allergy Allergies  Allergen Reactions  . Bextra (Valdecoxib) Nausea Only  . Tramadol Nausea Only   Current Outpatient Prescriptions  Medication Sig Dispense Refill  . aspirin 81 MG tablet Take 81 mg by mouth daily.      . cetirizine (ZYRTEC) 10 MG tablet Take 10 mg by mouth daily.      . metoprolol tartrate (LOPRESSOR) 25 MG tablet Take 12.5 mg by mouth 2 (two) times daily.      Marland Kitchen omeprazole (PRILOSEC) 20 MG capsule 1 po every morning 30 minutes prior to your first meal.  31 capsule  11  . oxycodone (OXY-IR) 5 MG capsule Take 5 mg by mouth every 6 (six) hours as needed. For pain      . simvastatin (ZOCOR) 20 MG tablet Take 20 mg by mouth every evening.      . triamcinolone ointment (KENALOG) 0.5 % Apply 1 application topically 2 (two) times daily.       Marland Kitchen DISCONTD: metoprolol (LOPRESSOR) 50 MG tablet Take 25 mg by mouth 2 (two) times daily.        Patient Active Problem List  Diagnosis  . GERD (gastroesophageal reflux disease)  . Hx of adenomatous colonic polyps  . Hyperlipidemia  . Degenerative joint disease  . Spinal stenosis   ROS:See HPI Eyes: Negative Ears:Negative for hearing loss, tinnitus Cardiovascular: has occasional ankle edema in the evening if she is  on her feet a lot,Negative for chest pain, palpitations,irregular heartbeat, dyspnea, dyspnea on exertion, near-syncope, orthopnea, paroxysmal nocturnal dyspnea and syncope, claudication, cyanosis,.  Respiratory:   Negative for cough, hemoptysis, shortness of breath, sleep disturbances due to breathing, sputum production and wheezing.   Endocrine: has a thyroid goiter to see endocrine next week  Hematologic/Lymphatic: Negative for adenopathy and bleeding problem. Does not bruise/bleed easily.  Musculoskeletal: significant arthritis  Gastrointestinal: positive for GERD Neurological: Negative.  Allergic/Immunologic: Negative for environmental allergies.  PHYSICAL EXAM: Obese, in no acute distress. Neck: moderate  thyroid enlargement;  No JVD, HJR, or Bruit Lungs: No tachypnea, clear without wheezing, rales, or rhonchi Cardiovascular: RRR, PMI not displaced, heart sounds normal, no murmurs, gallops, bruit, thrill, or heave. Abdomen: BS normal. Soft without organomegaly, masses, lesions or tenderness. Extremities: without cyanosis, clubbing or edema. Good distal pulses bilateral SKin: Warm, no lesions or rashes  Musculoskeletal: No deformities Neuro: no focal signs  EKG: sinus rhythm with single PVC; nonspecific ST-T wave changes; poor R wave progression; otherwise normal   Prior Assessment and Plan Problem List as of 03/02/2012            Cardiology Problems   Hyperlipidemia     Other   GERD (gastroesophageal reflux disease)   Last Assessment & Plan Note   12/31/2011 Office Visit Signed 12/31/2011  1:51 PM by Tiffany Kocher, PA    Overall she is doing much better. Her abdominal pain, nausea has resolved. Her bowel function has returned to normal. Would recommend she continue omeprazole every other day as she is doing. We provided her with #60 Prilosec OTC samples to help her given her financial situation. Office visit when necessary.    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 Last colonoscopy 2013, simple tubular adenoma. I will discuss with Dr. Darrick Penna with regards to when she liked to pursue the next colonoscopy. According to the operative note it says 10 year followup, but given history of tubulovillous adenoma I will discuss this with her.    Degenerative joint disease   Spinal stenosis      Patient interviewed and examined after initial evaluation by Jacolyn Reedy, PA-C.  Risk factors for coronary artery disease or modest, and symptoms to suggest myocardial ischemia are absent.  Recent negative stress echocardiogram  provides additional reassurance.  Planned surgery is relatively low risk with respect to the possibility of a perioperative cardiac complication.  Accordingly, I would recommend proceeding with no further testing or medical treatment.  No prior assessments of serum lipids are available.  Unless total and/or LDL cholesterol are extremely high, statin therapy is not warranted.  If no off treatment values are available, a lipid profile should be checked when she has stopped taking simvastatin.  She will do so after her current prescription has been exhausted.  I appreciate the referral of this for a nice woman and will be available to see her again in the future should additional cardiology issues arise.

## 2012-03-02 NOTE — Patient Instructions (Addendum)
Your physician recommends that you schedule a follow-up appointment in: As needed  

## 2012-03-02 NOTE — Assessment & Plan Note (Addendum)
Recently told to stop her simvastatin and start fish oil because of her excellent Cholesterol level.

## 2012-03-02 NOTE — Assessment & Plan Note (Addendum)
Patient was admitted for 2 days last year with atypical chest pain and had a stress Myoview the patient reports as normal. We are awaiting records to be faxed from [facility]. She is currently asymptomatic without chest pain and can probably proceed with surgery if her cardiac workup was normal a year ago.

## 2012-03-02 NOTE — Assessment & Plan Note (Signed)
Patient was admitted for 2 days last year with atypical chest pain and had a stress Myoview the patient reports as normal. We are awaiting records to be faxed from [facility]. She is currently asymptomatic without chest pain and can probably proceed with surgery if her cardiac workup was normal a year ago. 

## 2012-03-02 NOTE — Assessment & Plan Note (Signed)
Patient has appointment with an endocrinologist next week

## 2012-03-03 ENCOUNTER — Encounter: Payer: Self-pay | Admitting: Cardiology

## 2012-03-18 ENCOUNTER — Encounter: Payer: Self-pay | Admitting: *Deleted

## 2012-03-18 ENCOUNTER — Telehealth: Payer: Self-pay | Admitting: Physician Assistant

## 2012-03-18 NOTE — Telephone Encounter (Signed)
Patient was seen for surgical clearance by Dayton Scrape.  Her note stated that after receiving records from [facility] she would make decision.  Office has not heard anything and wanted to know where we were at on that clearance. / tg

## 2012-03-18 NOTE — Telephone Encounter (Signed)
According to Dr Marvel Plan assessment.  Patient has been cleared for surgery.  Letter sent.

## 2012-03-18 NOTE — Telephone Encounter (Signed)
Please advise 

## 2012-04-18 ENCOUNTER — Ambulatory Visit (HOSPITAL_COMMUNITY): Payer: Medicaid Other | Admitting: Physical Therapy

## 2012-04-20 ENCOUNTER — Ambulatory Visit (HOSPITAL_COMMUNITY)
Admission: RE | Admit: 2012-04-20 | Discharge: 2012-04-20 | Disposition: A | Discharge status: Home / Self Care | Payer: Medicaid Other | Source: Ambulatory Visit | Attending: Orthopedic Surgery | Admitting: Orthopedic Surgery

## 2012-04-20 DIAGNOSIS — R269 Unspecified abnormalities of gait and mobility: Secondary | ICD-10-CM | POA: Insufficient documentation

## 2012-04-20 DIAGNOSIS — IMO0001 Reserved for inherently not codable concepts without codable children: Secondary | ICD-10-CM | POA: Insufficient documentation

## 2012-04-20 DIAGNOSIS — M25559 Pain in unspecified hip: Secondary | ICD-10-CM | POA: Insufficient documentation

## 2012-04-20 DIAGNOSIS — R262 Difficulty in walking, not elsewhere classified: Secondary | ICD-10-CM | POA: Insufficient documentation

## 2012-04-20 DIAGNOSIS — Z96649 Presence of unspecified artificial hip joint: Secondary | ICD-10-CM | POA: Insufficient documentation

## 2012-04-20 DIAGNOSIS — M6281 Muscle weakness (generalized): Secondary | ICD-10-CM | POA: Insufficient documentation

## 2012-04-20 NOTE — Evaluation (Signed)
Physical Therapy Evaluation/Medicaid below  Patient Details  Name: Ruth Hughes MRN: [id number] Date of Birth: [birth year]  Today's Date: 04/20/2012 Time: 7829-5621 PT Time Calculation (min): 33 min Charges: 1 eval, 15' Gait, 8' TE  Visit#: 1  of 3   Re-eval: 05/20/12 Assessment Diagnosis: L THR Surgical Date: 03/29/12 Next MD Visit: Dr. Marca Ancona - 04/25/12  Authorization: MEDICAID  Authorization Time Period: 1 eval and 7 treatments from HHPT, leaving a total of 3 tx left for OP PT.  discussed and provided pt with Medicaid handout letter and discussed talking to Lubertha Basque for payment options  Authorization Visit#: 1  of 3    Past Medical History:  Past Medical History  Diagnosis Date  . GERD (gastroesophageal reflux disease)     Hiatal hernia  . Hx of adenomatous colonic polyps   . Hyperlipidemia     H/o PVCs  . Goiter   . Degenerative joint disease     s/p right THA; left THA planned for 03/2012  . Spinal stenosis     CT in 07/2010: L3-4; degenerative joint disease at L4-5   Past Surgical History:  Past Surgical History  Procedure Date  . Total hip arthroplasty 2001    Right  . Esophagogastroduodenoscopy 2005    hiatal hernia, GERD per patient  . Colonoscopy w/ polypectomy 12/14/2003    Dr. Patel-->1.5 cm size polyp with patchy whitish mucosa in sigmoid colon around base of polyp. Path-adenomatous polyp  . Knee surgery 03/04    right  . Colonoscopy 01/07/2001    hyperplastic polyp/tubulovillous adenoma with severe atypia  . Colonoscopy     + ileoscopy: per patient has additional one in 2006, 2007 and Flex sig in 2008. Told next colonoscopy in 2013.   [facility] Esophagogastroduodenoscopy 09/2000    Dr. Kristian Covey, minimal gastritis, bx no hpylori  . Colonoscopy 09/03/11    internal hemrrhoids/diverticulosis in the left colon/ TUBULAR ADENOMA of descending colon. random colon bx negative for microscopic colitis  . Esophagogastroduodenoscopy 09/03/11    small bowel bx negative  for celiac, mild chronic inactive gastritis, no h.pylori.    Subjective Symptoms/Limitations Pertinent History: Pt is a 56 year old female referred to PT s/p L THR on 03/29/12 due to DJD.  She has a significant hx of R THR (10 years previous) for DJD reasons and states she plans on having a revision sometime next year due to increased pain.  She has been to a rhematologist who is unable to explain the reasons for her severe joint deteriation and has increased anxiety and becomes very emotional during interview process.  PLOF: walking w/SPC, taking care of herself.  currently unemployeed.  Comes in today with standard walker and reports she will recieve her wheels for her RW on Thursday.  She has had 7 HHPT visits.  Her c/co's are decreased mobility and impaired strength.  She is able to independently explain her hip percautions.  Patient Stated Goals: Pt reports that she wants to get stronger and walk better.  Pain Assessment Currently in Pain?: Yes Pain Score:   2 Pain Location: Hip Pain Orientation: Left Pain Type: Acute pain;Surgical pain  Precautions/Restrictions  Precautions Precautions: Posterior Hip Precaution Comments: pt is independent with percaustions  Prior Functioning  Home Living Lives With: Daughter (and son in law) Type of Home: House Home Access: Stairs to enter [employer] of Steps: 3 Prior Function Level of Independence: Independent with basic ADLs;Needs assistance with gait (SPC w/gait) Driving: No Vocation: On disability Comments:  Reports she enjoys going to the senior center to work out.  She enjoys taking care of herself and her house.   Cognition/Observation Observation/Other Assessments Observations: emotionally liable  Sensation/Coordination/Flexibility/Functional Tests Functional Tests Functional Tests: 30 sec STS: 0x Functional Tests: ABC: 26%  Assessment LLE Strength Left Hip Flexion: 3/5 Left Hip Extension: 2+/5 Left Hip ABduction:  3/5 Left Hip ADduction: 3/5 Left Knee Flexion: 3/5 Left Knee Extension: 3/5 Left Ankle Dorsiflexion: 4/5 Palpation Palpation: pain and tenderness to L knee  Mobility/Balance  Ambulation/Gait Ambulation/Gait: Yes Ambulation/Gait Assistance: 6: Modified independent (Device/Increase time) Ambulation Distance (Feet): 112 Feet (2 minutes) Assistive device: Rolling walker Gait Pattern: Step-through pattern;Decreased weight shift to left;Decreased stance time - left Gait velocity: 0.93 ft/sec Static Standing Balance Single Leg Stance - Right Leg: 3  Single Leg Stance - Left Leg: 0  Tandem Stance - Right Leg: 10  Tandem Stance - Left Leg: 25  Rhomberg - Eyes Opened: 10  Rhomberg - Eyes Closed: 10  Timed Up and Go Test TUG: Normal TUG Normal TUG (seconds): 54  (w/RW)   Exercise/Treatments Standing Heel Raises: 10 reps;Limitations Heel Raises Limitations: toe raises 10 reps Functional Squat: 10 reps;Limitations Functional Squat Limitations: VC for technique Gait Training: x10 minutes w/RW' for technique w/cueing for posture.  TUG training: 49 sec.  Other Standing Knee Exercises: Staggard tandem stance (to keep within hip percautions) 1x30 sec BLE Seated Long Arc Quad: Left;10 reps;Limitations Long Arc Quad Limitations: TC for posture Other Seated Knee Exercises: STS w/o UE support: mod A from chair x3  Physical Therapy Assessment and Plan PT Assessment and Plan Clinical Impression Statement: Ruth Hughes is referred to OP PT s/p L THA on 03/29/12.  Treatment focused on improving gait with RW (specifically speed and posture).  She is emotionally liable during treatment today due to her PMH.  At this time have provided pt with information regarding total coverage allowed by her insurance and asked her to contact finacial advisor if she needs more treatment.   Pt will benefit from skilled therapeutic intervention in order to improve on the following deficits: Abnormal gait;Decreased  activity tolerance;Decreased strength;Difficulty walking;Decreased mobility;Pain Rehab Potential: Fair Clinical Impairments Affecting Rehab Potential: secondary to limitations of insurance (allowed 3 visits for OP PT) PT Frequency: Min 3X/week PT Duration: 8 weeks PT Treatment/Interventions: DME instruction;Gait training;Stair training;Functional mobility training;Therapeutic activities;Therapeutic exercise;Balance training;Neuromuscular re-education;Patient/family education;Manual techniques PT Plan: Continue with L LE strengthening to allow greater ease with ambulating with her RW (standing: heel abd and extension, knee flexion,.  Gait training w/RW, balance training.  Continue to encourage to improve overall function for remaining visits.     Goals Home Exercise Program Pt will Perform Home Exercise Program: Independently PT Goal: Perform Home Exercise Program - Progress: Goal set today PT Short Term Goals Time to Complete Short Term Goals: 4 weeks PT Short Term Goal 1: Pt will improve her LE strength by 1 muscle grade.  PT Short Term Goal 2: Pt will improve her static standing balance and demonstrate tandem stance x30 sec on BLE PT Short Term Goal 3: Pt will improve her mechanics and ambulate with appropriate posture and improve speed.  PT Long Term Goals Time to Complete Long Term Goals: 8 weeks PT Long Term Goal 1: Pt will improve her TUG to less than 25 sec w/LRAD for improved safety in to community.  PT Long Term Goal 2: Pt will improve her LE strength in order to tolerate ambulating x15 minutes w/LRAD Long Term Goal 3:  Pt will improve her 2 minute walk test and complete 600 feet for improved gait speed.   Problem List Patient Active Problem List  Diagnosis  . GERD (gastroesophageal reflux disease)  . Hx of adenomatous colonic polyps  . Hyperlipidemia  . Degenerative joint disease  . Spinal stenosis  . History of chest pain  . Goiter  . Preoperative clearance   PT Plan of  Care PT Home Exercise Plan: see scanned report PT Patient Instructions: discussed importance of HEP.  Discussed insurance limitations Consulted and Agree with Plan of Care: Patient;Family member/caregiver Family Member Consulted: Daughter Alcario Drought.  Aidan Moten, PT 04/20/2012, 11:05 AM  Physician Documentation Your signature is required to indicate approval of the treatment plan as stated above.  Please sign and either send electronically or make a copy of this report for your files and return this physician signed original.   Please mark one 1.__approve of plan  2. ___approve of plan with the following conditions.   ______________________________                                                          _____________________ Physician Signature                                                                                                             Date    INITIAL EVALUATION  Physical Therapy   Patient Name: Ruth Hughes Date Of Birth: 1955/09/29  Guardian Name: N/A Treatment ICD-9 Code: 09811  Address: [address] Date of Evaluation: 04/20/2012  [place], [place] [address] Requested Dates of Service: 04/20/2012 - 05/25/2012       Therapy History: No known therapy for this problem  Reason For Referral: Recipient has a new injury, disease or condition  Prior Level of Function: Independent/Modified Independent with all ADLs (OT/PT) or Audition, Communication, Voice and/or Swallowing Skills (ST/AUD)  Additional Medical History: Pertinent History: Pt is a 56 year old female referred to PT s/p L THR on 03/29/12 due to DJD. She has a significant hx of R THR (10 years previous) for DJD reasons and states she plans on having a revision sometime next year due to increased pain. She has been to a rhematologist who is unable to explain the reasons for her severe joint deteriation and has increased anxiety and becomes very emotional during interview process. PLOF: walking w/SPC, taking care of  herself. currently unemployeed. Comes in today with standard walker and reports she will recieve her wheels for her RW on Thursday. She has had 7 HHPT visits. Her c/co's are decreased mobility and impaired strength. She is able to independently explain her hip percautions. Patient Stated Goals: Pt reports that she wants to get stronger and walk better. Pain Assessment Currently in Pain?: Yes Pain Score: 2 Pain Location: Hip Pain Orientation: Left Pain Type: Acute pain;Surgical pain Precautions: Posterior  Hip Precaution Comments: pt is independent with precautions Lives With: Daughter (and son in law) Type of Home: House Home Access: Stairs to enter [employer] of Steps: 3 Prior Function Level of Independence: Independent with basic ADLs;Needs assistance with gait [facility] w/gait) Driving: No Vocation: On disability Comments: Reports she enjoys going to the senior center to work out. She enjoys taking care of herself and her house. Observations: emotionally liable Functional Tests: 30 sec STS: 0x Functional Tests: ABC: 26% Single Leg Stance - Right Leg: 3 Single Leg Stance - Left Leg: 0 Tandem Stance - Right Leg: 10 Tandem Stance - Left Leg: 25 Rhomberg - Eyes Opened: 10 Rhomberg - Eyes Closed: 10   Prematurity: N/A  Severity Level: N/A       Treatment Goals:  1. Goal: Pt will Perform Home Exercise Program: Independently  Baseline: Given today  Duration: 4 Week(s)  2. Goal: Pt will improve her LE strength by 1 muscle grade.  Baseline: Ambulation/Gait Assistance: 6: Modified independent (Device/Increase time) Ambulation Distance (Feet): 112 Feet (2 minutes) LLE Strength Left Hip Flexion: 3/5 Left Hip Extension: 2+/5 Left Hip ABduction: 3/5 Left Hip ADduction: 3/5 Left Knee Flexion: 3/5 Left Knee Extension: 3/5 Left Ankle Dorsiflexion: 4/5  Duration: 4 Week(s)  3. Goal: Pt will improve her mechanics and ambulate with appropriate posture and improve speed.  Baseline: Gait velocity: 0.93 ft/sec    Duration: 4 Week(s)  4. Goal: Pt will improve her TUG to less than 25 sec w/LRAD for improved safety in to community.  Baseline: Normal TUG (seconds): 54 (w/RW)  Duration: 8 Week(s)  5. Goal: Pt will improve her LE strength in order to tolerate ambulating x15 minutes w/LRAD.  Baseline: 6 minutes w/RW  Duration: 8 Week(s)  Goal: Pt will improve her 2 minute walk test and complete 600 feet for improved gait speed.  Baseline: 2 minutes: 112 feet w/RW  Duration: 8 Week(s)         Treatment Frequency/Duration:  3x/week for 5 weeks  Units per visit: N/A    Additional Information: Ms. Bauza is referred to OP PT s/p L THA on 03/29/12. Treatment focused on improving gait with RW (specifically speed and posture). She is emotionally liable during treatment today due to her PMH. At this time have provided pt with information regarding total coverage allowed by her insurance and asked her to contact finacial advisor if she needs more treatment. Pt will benefit from skilled therapeutic intervention in order to improve on the following deficits: Abnormal gait;Decreased activity tolerance;Decreased strength;Difficulty walking;Decreased mobility;Pain Rehab Potential: Fair Clinical Impairments Affecting Rehab Potential: secondary to limitations of insurance (allowed 3 visits for OP PT) PT Frequency: Min 3X/week PT Duration: 8 weeks PT Treatment/Interventions: DME instruction;Gait training;Stair training;Functional mobility training;Therapeutic activities;Therapeutic exercise;Balance training;Neuromuscular re-education;Patient/family education;Manual techniques           Therapist Signature  Date Physician Signature  Date    Annett Fabian       Therapist Name  Physician Name   Refer to the Review Status page for current case status

## 2012-04-22 ENCOUNTER — Ambulatory Visit (HOSPITAL_COMMUNITY)
Admission: RE | Admit: 2012-04-22 | Discharge: 2012-04-22 | Disposition: A | Discharge status: Home / Self Care | Payer: Medicaid Other | Source: Ambulatory Visit | Attending: Orthopedic Surgery | Admitting: Orthopedic Surgery

## 2012-04-22 NOTE — Progress Notes (Signed)
Physical Therapy Treatment Patient Details  Name: Ruth Hughes MRN: [id number] Date of Birth: 07-03-1955  Today's Date: 04/22/2012 Time: 0930-1012 PT Time Calculation (min): 42 min Charges: 15' TE,  Visit#: 2  of 3   Re-eval: 05/20/12    Authorization: MEDICAID  Authorization Time Period: 1 eval and 7 treatments from HHPT, leaving a total of 3 tx left for OP PT.  discussed and provided pt with Medicaid handout letter and discussed talking to Lubertha Basque for payment options  Authorization Visit#: 2  of 3    Subjective: Symptoms/Limitations Symptoms: Pt comes in today with RW and states she has a little soreness to her L knee.  Overall is doing well.  Pain Assessment Currently in Pain?: Yes Pain Score:   2 Pain Location: Knee Pain Orientation: Left  Precautions/Restrictions     Exercise/Treatments Standing Lateral Step Up: Left;Step Height: 4";10 reps;Limitations Lateral Step Up Limitations: Hip Hike 10x 4 in step Forward Step Up: Left;10 reps;Step Height: 4" Gait Training: x10 min w/RW x2 min and SPC x8 min w/min A-mod independent Standing Eyes Opened: Narrow base of support (BOS);Time;Limitations Standing Eyes Opened Time: 60 Standing Eyes Opened Limitations: w/pertabations Standing Eyes Closed: Narrow base of support (BOS);Limitations;Time;2 reps Standing Eyes Closed Time: 60 Standing Eyes Closed Limitations: w/pertabations Tandem Stance: Eyes open;3 reps;30 secs;Limitations (Right and Left) Tandem Stance Limitations: w/cervical rotation  Seated Long Arc Quad: Left;20 reps;Weights Long Arc Quad Weight: 4 lbs. Other Seated Knee Exercises: isometric hip adduction 10x10 sec holds Other Seated Knee Exercises: STS: 2x5 from 18.5 in surface indepent.   Physical Therapy Assessment and Plan PT Assessment and Plan Clinical Impression Statement: Pt is progressing quickly and is able to demonstrate modified independent gait w/SPC at end of treatment.  Able to go from STS  w/o UE support 18.5 in surface.  Continues to improve overall balance and improve confidence with ambulation. Clinical Impairments Affecting Rehab Potential: secondary to limitations of insurance (allowed 3 visits for OP PT) PT Frequency: Min 3X/week PT Plan: Continue with L LE strengthening to allow greater ease with ambulating with her RW (standing: heel abd and extension, knee flexion,.  Gait training w/RW, balance training.  Continue to encourage to improve overall function for remaining visits.     Goals    Problem List Patient Active Problem List  Diagnosis  . GERD (gastroesophageal reflux disease)  . Hx of adenomatous colonic polyps  . Hyperlipidemia  . Degenerative joint disease  . Spinal stenosis  . History of chest pain  . Goiter  . Preoperative clearance  . Hip pain  . S/P total hip arthroplasty  . Difficulty in walking  . Muscle weakness (generalized)    PT Plan of Care PT Home Exercise Plan: see scanned report PT Patient Instructions: discussed importance of HEP.  Discussed insurance limitations Consulted and Agree with Plan of Care: Patient;Family member/caregiver Family Member Consulted: Daughter Alcario Drought.  GP    Laranda Burkemper 04/22/2012, 10:18 AM

## 2012-04-27 ENCOUNTER — Ambulatory Visit (HOSPITAL_COMMUNITY): Payer: Medicaid Other | Admitting: Physical Therapy

## 2012-05-06 ENCOUNTER — Ambulatory Visit (HOSPITAL_COMMUNITY)
Admission: RE | Admit: 2012-05-06 | Discharge: 2012-05-06 | Disposition: A | Discharge status: Home / Self Care | Payer: Medicaid Other | Source: Ambulatory Visit | Attending: Orthopedic Surgery | Admitting: Orthopedic Surgery

## 2012-05-06 DIAGNOSIS — IMO0001 Reserved for inherently not codable concepts without codable children: Secondary | ICD-10-CM | POA: Insufficient documentation

## 2012-05-06 DIAGNOSIS — R269 Unspecified abnormalities of gait and mobility: Secondary | ICD-10-CM | POA: Insufficient documentation

## 2012-05-06 DIAGNOSIS — M6281 Muscle weakness (generalized): Secondary | ICD-10-CM | POA: Insufficient documentation

## 2012-05-06 NOTE — Evaluation (Signed)
Physical Therapy Discharge and treatment Patient Details  Name: Ruth Hughes MRN: [id number] Date of Birth: 1956/01/17  Today's Date: 05/06/2012 Time: 1020-1104 PT Time Calculation (min): 44 min Charges: 41' self care, 10' TE, 23' Gait Visit#: 3  of 3   Re-eval: 05/20/12 Assessment Diagnosis: L THR Surgical Date: 03/29/12 Next MD Visit: Dr. Marca Ancona - 04/25/12  Authorization: MEDICAID  Authorization Time Period: 1 eval and 7 treatments from HHPT, leaving a total of 3 tx left for OP PT.  discussed and provided pt with Medicaid handout letter and discussed talking to Lubertha Basque for payment options  Authorization Visit#: 3  of 3    Subjective Symptoms/Limitations Symptoms: Pt reports that she has been walking around the house with her SPC more and continues to use the RW outside.   Precautions/Restrictions  Precautions Precautions: Posterior Hip  Sensation/Coordination/Flexibility/Functional Tests Functional Tests Functional Tests: 5 STS: 14 sec (was 0)  Assessment LLE Strength LLE Overall Strength Comments: WFL  Mobility/Balance  Ambulation/Gait Ambulation/Gait Assistance: 7: Independent;6: Modified independent (Device/Increase time) Assistive device: Straight cane Gait Pattern: Trendelenburg Gait velocity: 1.69ft/sec (was 09.3) Static Standing Balance Single Leg Stance - Right Leg: 5  (was 3) Single Leg Stance - Left Leg: 5  (was 0) Tandem Stance - Right Leg: 30  (was 10) Tandem Stance - Left Leg: 30  (was 25) Timed Up and Go Test TUG: Normal TUG Normal TUG (seconds): 13  (independent (was 54 w/RW))   Exercise/Treatments Standing Forward Step Ups: 5 reps; 6 in step; 1 HHA Lateral Step Ups: 5 reps; 6 in step; 0 HHA SLS: L SLS w/R knee into wall 6x10 sec holds Gait Training: x15 minutes (15 feet) indoor and outdoors surfaces w/SPC; independent x300 feet w/cueing for posture and decrease trendelenburg gait Other Standing Knee Exercises: Staggard tandem stance  (to keep within hip percautions) 1x30 sec BLE Seated Other Seated Knee Exercises: hip abd 10x w/blue band Other Seated Knee Exercises: STS: x5 from 18.5 in surface indepent 14 sec  Physical Therapy Assessment and Plan PT Assessment and Plan Clinical Impression Statement: Ms. Duplechain has atteded 3 OP PT visits s/p L THR with following findings: has met 6/7 goals and has made significant gains toward in her overall gait speed, has improved strength to [facility], has improved balance, ambulating with a SPC in and outdoors for greater than 15 minutes.  She reports and demonstrates adherence with HEP.  At this time will D/C from PT secondary to insurance limiations w/advanced HEP Clinical Impairments Affecting Rehab Potential: secondary to limitations of insurance (allowed 3 visits for OP PT) PT Plan: D/C    Goals Pt will Perform Home Exercise Program: Independently: Met PT Short Term Goals: 4 weeks Goal 1: Pt will improve her LE strength by 1 muscle grade. : Met Goal 2: Pt will improve her static standing balance and demonstrate tandem stance x30 sec on BLE: Met Goal 3: Pt will improve her mechanics and ambulate with appropriate posture and improve speed.: Met (mild Trendelenberg, suggested heel lift for RLE if needed) PT Long Term Goals: 8 weeks Gal 1: Pt will improve her TUG to less than 25 sec w/LRAD for improved safety in to community.: Met (independent 18 sec. ) Goal 2: Pt will improve her LE strength in order to tolerate ambulating x15 minutes w/LRAD: Met Goal 3: Pt will improve her 2 minute walk test and complete 600 feet for improved gait speed.: Progressing toward goal (2 minutes 200 ft w/SPC and independent)  Problem List Patient  Active Problem List  Diagnosis  . GERD (gastroesophageal reflux disease)  . Hx of adenomatous colonic polyps  . Hyperlipidemia  . Degenerative joint disease  . Spinal stenosis  . History of chest pain  . Goiter  . Preoperative clearance  . Hip pain  . S/P  total hip arthroplasty  . Difficulty in walking  . Muscle weakness (generalized)    PT Plan of Care PT Home Exercise Plan: updated w/advanced HEP and provided w/blue t-band.  Miklos Bidinger, PT 05/06/2012, 11:15 AM  Physician Documentation Your signature is required to indicate approval of the treatment plan as stated above.  Please sign and either send electronically or make a copy of this report for your files and return this physician signed original.   Please mark one 1.__approve of plan  2. ___approve of plan with the following conditions.   ______________________________                                                          _____________________ Physician Signature                                                                                                             Date

## 2012-08-30 HISTORY — PX: TOTAL HIP ARTHROPLASTY: SHX124

## 2012-10-07 ENCOUNTER — Other Ambulatory Visit (HOSPITAL_COMMUNITY): Payer: Self-pay | Admitting: Gastroenterology

## 2012-10-10 ENCOUNTER — Other Ambulatory Visit: Payer: Self-pay

## 2012-10-10 MED ORDER — OMEPRAZOLE 20 MG PO CPDR: DELAYED_RELEASE_CAPSULE | ORAL | Status: DC

## 2013-06-05 ENCOUNTER — Encounter: Payer: Self-pay | Admitting: Gastroenterology

## 2013-06-14 ENCOUNTER — Ambulatory Visit: Payer: Medicaid Other | Admitting: Gastroenterology

## 2013-06-28 ENCOUNTER — Encounter: Payer: Self-pay | Admitting: Gastroenterology

## 2013-06-28 ENCOUNTER — Ambulatory Visit (INDEPENDENT_AMBULATORY_CARE_PROVIDER_SITE_OTHER): Payer: Medicare Other | Admitting: Gastroenterology

## 2013-06-28 ENCOUNTER — Encounter (INDEPENDENT_AMBULATORY_CARE_PROVIDER_SITE_OTHER): Payer: Self-pay

## 2013-06-28 VITALS — BP 123/81 | HR 61 | Temp 98.3°F | Ht 66.0 in | Wt 212.0 lb

## 2013-06-28 DIAGNOSIS — R1011 Right upper quadrant pain: Secondary | ICD-10-CM

## 2013-06-28 DIAGNOSIS — G8929 Other chronic pain: Secondary | ICD-10-CM | POA: Insufficient documentation

## 2013-06-28 DIAGNOSIS — R109 Unspecified abdominal pain: Secondary | ICD-10-CM | POA: Insufficient documentation

## 2013-06-28 DIAGNOSIS — R1013 Epigastric pain: Secondary | ICD-10-CM

## 2013-06-28 MED ORDER — HYDROCODONE-ACETAMINOPHEN 5-500 MG PO TABS: 1.0000 | ORAL_TABLET | Freq: Four times a day (QID) | ORAL | Status: DC | PRN

## 2013-06-28 NOTE — Progress Notes (Signed)
cc'd to pcp 

## 2013-06-28 NOTE — Progress Notes (Signed)
Primary Care Physician: Cameron Proud, FNP  Primary Gastroenterologist:  Barney Drain, MD   Chief Complaint  Patient presents with  . Abdominal Pain    HPI: Ruth Hughes is a 58 y.o. female here for further evaluation of chronic right-sided abdominal pain. She was referred back to [place] by her PCP. Last seen in August of 2013 for followup with EGD and colonoscopy at that time. She had a tubular adenoma removed. Random colon biopsies at that time were negative. Small bowel biopsies negative for celiac disease. She had mild gastritis likely due to aspirin.  Presents with acute on chronic right sided abdominal pain. Radiating into back. Hurts all the time. Hurts worse with sitting or bending over. Sometimes worse with meals. No relief. Feels like something going burst. Tingling pain. No N/V. Poor appetite. BM daily, normal. No melena, brbpr. Epigastric burning worse lately. Avoids acidic foods. Could not give blood recently because Hgb did not pass. 12.2. No caffeine. No dysphagia. Pain in right side up to a 7. No dysuria. States that she has some back disease but not aware of any bulging disc. Denies NSAID use.    Current Outpatient Prescriptions  Medication Sig Dispense Refill  . aspirin 81 MG tablet Take 81 mg by mouth daily.      . cetirizine (ZYRTEC) 10 MG tablet Take 10 mg by mouth daily.      . Cholecalciferol (VITAMIN D3) 2000 UNITS TABS Take 2,000 Units by mouth daily.      . metoprolol tartrate (LOPRESSOR) 25 MG tablet Take 12.5 mg by mouth 2 (two) times daily.      . Omega-3 Fatty Acids (OMEGA-3 FISH OIL PO) Take 1,000 mg by mouth daily.      Marland Kitchen omeprazole (PRILOSEC) 20 MG capsule 1 po every morning 30 minutes prior to your first meal.  30 capsule  11  . sertraline (ZOLOFT) 50 MG tablet Take 50 mg by mouth daily.      . simvastatin (ZOCOR) 20 MG tablet Take 20 mg by mouth every evening.      . triamcinolone ointment (KENALOG) 0.5 % Apply 1 application topically 2 (two)  times daily.        No current facility-administered medications for this visit.    Allergies as of 06/28/2013 - Review Complete 06/28/2013  Allergen Reaction Noted  . Bextra [valdecoxib] Nausea Only 08/24/2011  . Tramadol Nausea Only 08/24/2011   Past Medical History  Diagnosis Date  . GERD (gastroesophageal reflux disease)     Hiatal hernia  . Hx of adenomatous colonic polyps   . Hyperlipidemia     H/o PVCs  . Goiter   . Degenerative joint disease     s/p right THA; left THA planned for 03/2012  . Spinal stenosis     CT in 07/2010: L3-4; degenerative joint disease at L4-5   Past Surgical History  Procedure Laterality Date  . Total hip arthroplasty  2001    Right  . Esophagogastroduodenoscopy  2005    hiatal hernia, GERD per patient  . Colonoscopy w/ polypectomy  12/14/2003    Dr. Patel-->1.5 cm size polyp with patchy whitish mucosa in sigmoid colon around base of polyp. Path-adenomatous polyp  . Knee surgery  03/04    right  . Colonoscopy  01/07/2001    hyperplastic polyp/tubulovillous adenoma with severe atypia  . Colonoscopy      + ileoscopy: per patient has additional one in 2006, 2007 and Flex sig in  2008. Told next colonoscopy in 2013.   Marland Kitchen Esophagogastroduodenoscopy  09/2000    Dr. Betti Cruz, minimal gastritis, bx no hpylori  . Colonoscopy  09/03/11    internal hemrrhoids/diverticulosis in the left colon/ TUBULAR ADENOMA of descending colon. random colon bx negative for microscopic colitis. next TCS 08/2016  . Esophagogastroduodenoscopy  09/03/11    small bowel bx negative for celiac, mild chronic inactive gastritis, no h.pylori.      ROS:  General: Negative for weight loss, fever, chills, fatigue, weakness. See history of present illness. ENT: Negative for hoarseness, difficulty swallowing , nasal congestion. CV: Negative for chest pain, angina, palpitations, dyspnea on exertion, peripheral edema.  Respiratory: Negative for dyspnea at rest, dyspnea on exertion,  cough, sputum, wheezing.  GI: See history of present illness. GU:  Negative for dysuria, hematuria, urinary incontinence, urinary frequency, nocturnal urination.  Endo: Negative for unusual weight change.    Physical Examination:   BP 123/81  Pulse 61  Temp(Src) 98.3 F (36.8 C) (Oral)  Ht 5\' 6"  (1.676 m)  Wt 212 lb (96.163 kg)  BMI 34.23 kg/m2  General: Well-nourished, well-developed in no acute distress.  Eyes: No icterus. Mouth: Oropharyngeal mucosa moist and pink , no lesions erythema or exudate. Lungs: Clear to auscultation bilaterally.  Heart: Regular rate and rhythm, no murmurs rubs or gallops.  Abdomen: Bowel sounds are normal, moderate epigastric/right upper and right mid abdominal tenderness to light and deep palpation. Notable tenderness with palpation along the rib cage extending all the way into the back. No CVA tenderness. nondistended, no hepatosplenomegaly or masses, no abdominal bruits or hernia , no rebound or guarding.   Extremities: No lower extremity edema. No clubbing or deformities. Neuro: Alert and oriented x 4   Skin: Warm and dry, no jaundice.   Psych: Alert and cooperative, normal mood and affect.  Labs:  No recent labs available. Have requested 05/2013 labs.   Imaging Studies: No results found.

## 2013-06-28 NOTE — Patient Instructions (Signed)
1. Please have CT scan done as scheduled. 2. Prescription for pain medication provided for short-term use until workup complete.

## 2013-06-28 NOTE — Assessment & Plan Note (Signed)
Some vague epigastric burning, at times worse with feeds. History of gastritis related to aspirin use. States she takes her omeprazole daily as prescribed. Right sided abdominal pain tends to be more severe. Worse with movement and with meals. Worse with stress, wakes her up at night. Suspect multifactorial with some musculoskeletal component. But given postprandial right-sided abdominal pain, will pursue CT scan of the abdomen and pelvis to exclude GI etiology. She may need further evaluation of her back to see if there is any evidence of nerve root impingement. She has an upcoming appointment with her PCP and will discuss referral.  Obtain recent labs from PCP for review. CT abdomen and pelvis with contrast. Continue PPI.

## 2013-07-03 ENCOUNTER — Encounter: Payer: Self-pay | Admitting: Gastroenterology

## 2013-07-03 ENCOUNTER — Ambulatory Visit (HOSPITAL_COMMUNITY)
Admission: RE | Admit: 2013-07-03 | Discharge: 2013-07-03 | Disposition: A | Discharge status: Home / Self Care | Payer: Medicare Other | Source: Ambulatory Visit | Attending: Gastroenterology | Admitting: Gastroenterology

## 2013-07-03 DIAGNOSIS — K7689 Other specified diseases of liver: Secondary | ICD-10-CM | POA: Insufficient documentation

## 2013-07-03 DIAGNOSIS — R1013 Epigastric pain: Secondary | ICD-10-CM

## 2013-07-03 DIAGNOSIS — R1031 Right lower quadrant pain: Secondary | ICD-10-CM | POA: Insufficient documentation

## 2013-07-03 DIAGNOSIS — R1011 Right upper quadrant pain: Secondary | ICD-10-CM

## 2013-07-03 DIAGNOSIS — G8929 Other chronic pain: Secondary | ICD-10-CM

## 2013-07-03 MED ORDER — IOHEXOL 300 MG/ML  SOLN
100.0000 mL | Freq: Once | INTRAMUSCULAR | Status: AC | PRN
  Administered 2013-07-03: 100 mL via INTRAVENOUS

## 2013-07-05 NOTE — Progress Notes (Addendum)
I PERSONALLY REVIEWED CT WITH DR. Thornton Papas. WEIGHT DOWN 5 LBS. CONSIDER EGD FOR EPIGASTRIC/RUQ PAIN. REVIEWED.

## 2013-07-05 NOTE — Progress Notes (Signed)
Received labs from PCP dated 05/15/2013. Glucose 80, creatinine 0.65.

## 2013-07-10 LAB — BASIC METABOLIC PANEL
BUN: 17 mg/dL (ref 4–21)
Creat: 0.65

## 2013-07-11 ENCOUNTER — Other Ambulatory Visit: Payer: Self-pay | Admitting: Gastroenterology

## 2013-07-11 DIAGNOSIS — R1013 Epigastric pain: Secondary | ICD-10-CM

## 2013-07-11 NOTE — Progress Notes (Signed)
Quick Note:  Pt is aware and OK to schedule EGD. ______

## 2013-07-13 ENCOUNTER — Encounter (HOSPITAL_COMMUNITY): Payer: Self-pay | Admitting: Pharmacy Technician

## 2013-07-19 ENCOUNTER — Telehealth: Payer: Self-pay | Admitting: Gastroenterology

## 2013-07-19 NOTE — Telephone Encounter (Signed)
R/S EGD to 03/10

## 2013-07-19 NOTE — Telephone Encounter (Signed)
Pt wants to [facility] her EGD that is scheduled for Friday with [facility] due to weather. Please call her back at [phone]

## 2013-08-08 ENCOUNTER — Encounter (HOSPITAL_COMMUNITY): Payer: Self-pay | Admitting: *Deleted

## 2013-08-08 ENCOUNTER — Ambulatory Visit (HOSPITAL_COMMUNITY)
Admission: RE | Admit: 2013-08-08 | Discharge: 2013-08-08 | Disposition: A | Discharge status: Home / Self Care | Payer: Medicare Other | Source: Ambulatory Visit | Attending: Gastroenterology | Admitting: Gastroenterology

## 2013-08-08 ENCOUNTER — Encounter (HOSPITAL_COMMUNITY)
Admission: RE | Disposition: A | Discharge status: Home / Self Care | Payer: Self-pay | Source: Ambulatory Visit | Attending: Gastroenterology

## 2013-08-08 DIAGNOSIS — R12 Heartburn: Secondary | ICD-10-CM | POA: Insufficient documentation

## 2013-08-08 DIAGNOSIS — K2970 Gastritis, unspecified, without bleeding: Secondary | ICD-10-CM

## 2013-08-08 DIAGNOSIS — K2990 Gastroduodenitis, unspecified, without bleeding: Secondary | ICD-10-CM

## 2013-08-08 DIAGNOSIS — E785 Hyperlipidemia, unspecified: Secondary | ICD-10-CM | POA: Insufficient documentation

## 2013-08-08 DIAGNOSIS — K449 Diaphragmatic hernia without obstruction or gangrene: Secondary | ICD-10-CM | POA: Insufficient documentation

## 2013-08-08 DIAGNOSIS — Z6834 Body mass index (BMI) 34.0-34.9, adult: Secondary | ICD-10-CM | POA: Insufficient documentation

## 2013-08-08 DIAGNOSIS — R1013 Epigastric pain: Secondary | ICD-10-CM

## 2013-08-08 DIAGNOSIS — D131 Benign neoplasm of stomach: Secondary | ICD-10-CM | POA: Insufficient documentation

## 2013-08-08 DIAGNOSIS — Z7982 Long term (current) use of aspirin: Secondary | ICD-10-CM | POA: Insufficient documentation

## 2013-08-08 HISTORY — PX: ESOPHAGOGASTRODUODENOSCOPY: SHX5428

## 2013-08-08 SURGERY — EGD (ESOPHAGOGASTRODUODENOSCOPY)
Anesthesia: Moderate Sedation

## 2013-08-08 MED ORDER — LIDOCAINE VISCOUS 2 % MT SOLN
OROMUCOSAL | Status: AC
  Filled 2013-08-08: qty 15

## 2013-08-08 MED ORDER — MIDAZOLAM HCL 5 MG/5ML IJ SOLN
INTRAMUSCULAR | Status: AC
  Filled 2013-08-08: qty 10

## 2013-08-08 MED ORDER — STERILE WATER FOR IRRIGATION IR SOLN
Status: DC | PRN
  Administered 2013-08-08: 10:00:00

## 2013-08-08 MED ORDER — SODIUM CHLORIDE 0.9 % IV SOLN
INTRAVENOUS | Status: DC
  Administered 2013-08-08: 09:00:00 via INTRAVENOUS

## 2013-08-08 MED ORDER — SODIUM CHLORIDE 0.9 % IJ SOLN
INTRAMUSCULAR | Status: AC
  Filled 2013-08-08: qty 10

## 2013-08-08 MED ORDER — OMEPRAZOLE 20 MG PO CPDR: DELAYED_RELEASE_CAPSULE | ORAL | Status: DC

## 2013-08-08 MED ORDER — PROMETHAZINE HCL 25 MG/ML IJ SOLN
INTRAMUSCULAR | Status: AC
  Filled 2013-08-08: qty 1

## 2013-08-08 MED ORDER — MEPERIDINE HCL 100 MG/ML IJ SOLN
INTRAMUSCULAR | Status: DC | PRN
  Administered 2013-08-08 (×2): 25 mg via INTRAVENOUS
  Administered 2013-08-08: 50 mg via INTRAVENOUS

## 2013-08-08 MED ORDER — MIDAZOLAM HCL 5 MG/5ML IJ SOLN
INTRAMUSCULAR | Status: DC | PRN
  Administered 2013-08-08 (×2): 2 mg via INTRAVENOUS
  Administered 2013-08-08: 1 mg via INTRAVENOUS

## 2013-08-08 MED ORDER — LIDOCAINE VISCOUS 2 % MT SOLN
OROMUCOSAL | Status: DC | PRN
  Administered 2013-08-08: 1 via OROMUCOSAL

## 2013-08-08 MED ORDER — PROMETHAZINE HCL 25 MG/ML IJ SOLN
12.5000 mg | Freq: Once | INTRAMUSCULAR | Status: AC
  Administered 2013-08-08: 12.5 mg via INTRAVENOUS

## 2013-08-08 MED ORDER — MEPERIDINE HCL 100 MG/ML IJ SOLN
INTRAMUSCULAR | Status: AC
  Filled 2013-08-08: qty 2

## 2013-08-08 NOTE — Discharge Instructions (Signed)
YOUR UPPER ABDOMINAL PAIN & NAUSEA ARE DUE TO gastritis AND REFLUX. YOU HAVE A SMALL HIATAL HERNIA. I biopsied your stomach.   CONTINUE OMEPRAZOLE.  TAKE 30 MINUTES PRIOR TO YOUR MEALS TWICE DAILY.  CONTINUE YOUR WEIGHT LOSS EFFORTS. YOU SHOULD LOSE 10 LBS.  STRICTLY FOLLOW A LOW FAT DIET. SEE INFO BELOW.  Call in 3 weeks if your symptoms are not improved.  YOUR BIOPSY WILL BE BACK IN 7 DAYS.  FOLLOW UP July 2015.   UPPER ENDOSCOPY AFTER CARE Read the instructions outlined below and refer to this sheet in the next week. These discharge instructions provide you with general information on caring for yourself after you leave the hospital. While your treatment has been planned according to the most current medical practices available, unavoidable complications occasionally occur. If you have any problems or questions after discharge, call DR. Nicolemarie Wooley, [phone].  ACTIVITY  You may resume your regular activity, but move at a slower pace for the next 24 hours.   Take frequent rest periods for the next 24 hours.   Walking will help get rid of the air and reduce the bloated feeling in your belly (abdomen).   No driving for 24 hours (because of the medicine (anesthesia) used during the test).   You may shower.   Do not sign any important legal documents or operate any machinery for 24 hours (because of the anesthesia used during the test).    NUTRITION  Drink plenty of fluids.   You may resume your normal diet as instructed by your doctor.   Begin with a light meal and progress to your normal diet. Heavy or fried foods are harder to digest and may make you feel sick to your stomach (nauseated).   Avoid alcoholic beverages for 24 hours or as instructed.    MEDICATIONS  You may resume your normal medications.   WHAT YOU CAN EXPECT TODAY  Some feelings of bloating in the abdomen.   Passage of more gas than usual.    IF YOU HAD A BIOPSY TAKEN DURING THE UPPER  ENDOSCOPY:  Eat a soft diet IF YOU HAVE NAUSEA, BLOATING, ABDOMINAL PAIN, OR VOMITING.    FINDING OUT THE RESULTS OF YOUR TEST Not all test results are available during your visit. DR. Oneida Alar WILL CALL YOU WITHIN 7 DAYS OF YOUR PROCEDUE WITH YOUR RESULTS. Do not assume everything is normal if you have not heard from DR. Sharief Wainwright IN ONE WEEK, CALL HER OFFICE AT [phone].  SEEK IMMEDIATE MEDICAL ATTENTION AND CALL THE OFFICE: [phone] IF:  You have more than a spotting of blood in your stool.   Your belly is swollen (abdominal distention).   You are nauseated or vomiting.   You have a temperature over 101F.   You have abdominal pain or discomfort that is severe or gets worse throughout the day.   Gastritis  Gastritis is an inflammation (the body's way of reacting to injury and/or infection) of the stomach. It is often caused by bacterial (germ) infections. It can also be caused BY ASPIRIN, BC/GOODY POWDER'S, (IBUPROFEN) MOTRIN, OR ALEVE (NAPROXEN), chemicals (including alcohol), SPICY FOODS, and medications. This illness may be associated with generalized malaise (feeling tired, not well), UPPER ABDOMINAL STOMACH cramps, and fever. One common bacterial cause of gastritis is an organism known as H. Pylori. This can be treated with antibiotics.   REFLUX   TREATMENT There are a number of non-prescription medicines used to treat reflux including: Antacids.  ZANTAC Proton-pump inhibitors: PRILOSEC (OMEPRAZOLE)  HOME CARE INSTRUCTIONS Eat 2-3 hours before going to bed.  Try to reach and maintain a healthy weight. LOSE 10-20 LBS Do not eat just a few very large meals. Instead, eat 4 TO 6 smaller meals throughout the day.  Try to identify foods and beverages that make your symptoms worse, and avoid these.  Avoid tight clothing.  Do not exercise right after eating.   Low-Fat Diet BREADS, CEREALS, PASTA, RICE, DRIED PEAS, AND BEANS These products are high in carbohydrates and  most are low in fat. Therefore, they can be increased in the diet as substitutes for fatty foods. They too, however, contain calories and should not be eaten in excess. Cereals can be eaten for snacks as well as for breakfast.  Include foods that contain fiber (fruits, vegetables, whole grains, and legumes). Research shows that fiber may lower blood cholesterol levels, especially the water-soluble fiber found in fruits, vegetables, oat products, and legumes. FRUITS AND VEGETABLES It is good to eat fruits and vegetables. Besides being sources of fiber, both are rich in vitamins and some minerals. They help you get the daily allowances of these nutrients. Fruits and vegetables can be used for snacks and desserts. MEATS Limit lean meat, chicken, [place], and fish to no more than 6 ounces per day. Beef, Pork, and Lamb Use lean cuts of beef, pork, and lamb. Lean cuts include:  Extra-lean ground beef.  Arm roast.  Sirloin tip.  Center-cut ham.  Round steak.  Loin chops.  Rump roast.  Tenderloin.  Trim all fat off the outside of meats before cooking. It is not necessary to severely decrease the intake of red meat, but lean choices should be made. Lean meat is rich in protein and contains a highly absorbable form of iron. Premenopausal women, in particular, should avoid reducing lean red meat because this could increase the risk for low red blood cells (iron-deficiency anemia).  Chicken and [place] These are good sources of protein. The fat of poultry can be reduced by removing the skin and underlying fat layers before cooking. Chicken and [place] can be substituted for lean red meat in the diet. Poultry should not be fried or covered with high-fat sauces. Fish and Shellfish Fish is a good source of protein. Shellfish contain cholesterol, but they usually are low in saturated fatty acids. The preparation of fish is important. Like chicken and [place], they should not be fried or covered with high-fat  sauces. EGGS Egg whites contain no fat or cholesterol. They can be eaten often. Try 1 to 2 egg whites instead of whole eggs in recipes or use egg substitutes that do not contain yolk.  MILK AND DAIRY PRODUCTS Use skim or 1% milk instead of 2% or whole milk. Decrease whole milk, natural, and processed cheeses. Use nonfat or low-fat (2%) cottage cheese or low-fat cheeses made from vegetable oils. Choose nonfat or low-fat (1 to 2%) yogurt. Experiment with evaporated skim milk in recipes that call for heavy cream. Substitute low-fat yogurt or low-fat cottage cheese for sour cream in dips and salad dressings. Have at least 2 servings of low-fat dairy products, such as 2 glasses of skim (or 1%) milk each day to help get your daily calcium intake.  FATS AND OILS Butterfat, lard, and beef fats are high in saturated fat and cholesterol. These should be avoided.Vegetable fats do not contain cholesterol. AVOID coconut oil, palm oil, and palm kernel oil, WHICH are very high in saturated fats. These should be limited. These fats are  often used in [employer], processed foods, popcorn, oils, and nondairy creamers. Vegetable shortenings and some peanut butters contain hydrogenated oils, which are also saturated fats. Read the labels on these foods and check for saturated vegetable oils.  Desirable liquid vegetable oils are corn oil, cottonseed oil, olive oil, canola oil, safflower oil, soybean oil, and sunflower oil. Peanut oil is not as good, but small amounts are acceptable. Buy a heart-healthy tub margarine that has no partially hydrogenated oils in the ingredients. AVOID Mayonnaise and salad dressings often are made from unsaturated fats.  OTHER EATING TIPS Snacks  Most sweets should be limited as snacks. They tend to be rich in calories and fats, and their caloric content outweighs their nutritional value. Some good choices in snacks are graham crackers, melba toast, soda crackers, bagels (no egg), English  muffins, fruits, and vegetables. These snacks are preferable to snack crackers, [place] fries, and chips. Popcorn should be air-popped or cooked in small amounts of liquid vegetable oil.  Desserts Eat fruit, low-fat yogurt, and fruit ices instead of pastries, cake, and cookies. Sherbet, angel food cake, gelatin dessert, frozen low-fat yogurt, or other frozen products that do not contain saturated fat (pure fruit juice bars, frozen ice pops) are also acceptable.   COOKING METHODS Choose those methods that use little or no fat. They include: Poaching.  Braising.  Steaming.  Grilling.  Baking.  Stir-frying.  Broiling.  Microwaving.  Foods can be cooked in a nonstick pan without added fat, or use a nonfat cooking spray in regular cookware. Limit fried foods and avoid frying in saturated fat. Add moisture to lean meats by using water, broth, cooking wines, and other nonfat or low-fat sauces along with the cooking methods mentioned above. Soups and stews should be chilled after cooking. The fat that forms on top after a few hours in the refrigerator should be skimmed off. When preparing meals, avoid using excess salt. Salt can contribute to raising blood pressure in some people.  EATING AWAY FROM HOME Order entres, potatoes, and vegetables without sauces or butter. When meat exceeds the size of a deck of cards (3 to 4 ounces), the rest can be taken home for another meal. Choose vegetable or fruit salads and ask for low-calorie salad dressings to be served on the side. Use dressings sparingly. Limit high-fat toppings, such as bacon, crumbled eggs, cheese, sunflower seeds, and olives. Ask for heart-healthy tub margarine instead of butter.  Hiatal Hernia A hiatal hernia occurs when a part of the stomach slides above the diaphragm. The diaphragm is the thin muscle separating the belly (abdomen) from the chest. A hiatal hernia can be something you are born with or develop over time. Hiatal hernias may  allow stomach acid to flow back into your esophagus, the tube which carries food from your mouth to your stomach. If this acid causes problems it is called GERD (gastro-esophageal reflux disease).   SYMPTOMS Common symptoms of GERD are heartburn (burning in your chest). This is worse when lying down or bending over. It may also cause belching and indigestion. Some of the things which make GERD worse are:  Increased weight pushes on stomach making acid rise more easily.   Smoking markedly increases acid production.   Alcohol decreases lower esophageal sphincter pressure (valve between stomach and esophagus), allowing acid from stomach into esophagus.   Late evening meals and going to bed with a full stomach increases pressure.   Anything that causes an increase in acid production.  HOME CARE INSTRUCTIONS  Try to achieve and maintain an ideal body weight.   Avoid drinking alcoholic beverages.   DO NOT smokE.   Do not wear tight clothing around your chest or stomach.   Eat smaller meals and eat more frequently. This keeps your stomach from getting too full. Eat slowly.   Do not lie down for 2 or 3 hours after eating. Do not eat or drink anything 1 to 2 hours before going to bed.   Avoid caffeine beverages (colas, coffee, cocoa, tea), fatty foods, citrus fruits and all other foods and drinks that contain acid and that seem to increase the problems.   Avoid bending over, especially after eating OR STRAINING. Anything that increases the pressure in your belly increases the amount of acid that may be pushed up into your esophagus.

## 2013-08-08 NOTE — Op Note (Signed)
[facility] [address] [place], [address]   ENDOSCOPY PROCEDURE REPORT  PATIENT: Gray, Maugeri  MR#: [id number] BIRTHDATE: Sep 09, 1955 , 24  yrs. old GENDER: Female  ENDOSCOPIST: Barney Drain, MD REFERRED TS:[id number] [place]au[place], [place]  PROCEDURE DATE: 08/08/2013 PROCEDURE:   EGD w/ biopsy  INDICATIONS:Heartburn. MEDICATIONS: Demerol 100 mg IV, Versed 6 mg IV, and Promethazine (Phenergan) 12.5mg  IV TOPICAL ANESTHETIC:   Viscous Xylocaine  DESCRIPTION OF PROCEDURE:     Physical exam was performed.  Informed consent was obtained from the patient after explaining the benefits, risks, and alternatives to the procedure.  The patient was connected to the monitor and placed in the left lateral position.  Continuous oxygen was provided by nasal cannula and IV medicine administered through an indwelling cannula.  After administration of sedation, the patients esophagus was intubated and the EG-[id number] ([id number])  endoscope was advanced under direct visualization to the second portion of the duodenum.  The scope was removed slowly by carefully examining the color, texture, anatomy, and integrity of the mucosa on the way out.  The patient was recovered in endoscopy and discharged home in satisfactory condition.   ESOPHAGUS: The mucosa of the esophagus appeared normal.   A small hiatal hernia was noted.   STOMACH: Multiple small sessile polyps were found in the gastric body.  Multiple biopsies was performed using cold forceps.   Mild non-erosive gastritis (inflammation) was found in the gastric antrum.  Multiple biopsies were performed using cold forceps.  COMPLICATIONS:   None  ENDOSCOPIC IMPRESSION: 1.   MILD GASTRITIS 2.   Small hiatal hernia 3.   Multiple small sessile polyps  RECOMMENDATIONS: CONTINUE OMEPRAZOLE.  TAKE 30 MINUTES PRIOR TO YOUR MEALS TWICE DAILY. CONTINUE YOUR WEIGHT LOSS EFFORTS.  LOSE 10 LBS. STRICTLY FOLLOW A LOW FAT DIET. Call in 3 weeks if  your symptoms are not improved.  CONSIDER HIDA IF SX PERSIST AND BIOPSIES NEGATIVE. AWAIT BIOPSY RESULTS. FOLLOW UP July 2015.   REPEAT EXAM:   _______________________________ Lorrin MaisBarney Drain, MD 08/08/2013 2:26 PM       PATIENT NAME:  Jing, Howatt MR#: [id number]

## 2013-08-08 NOTE — H&P (Signed)
Primary Care Physician:  Vesta Mixer Primary Gastroenterologist:  Dr. Oneida Alar  Pre-Procedure History & Physical: HPI:  Ruth Hughes is a 58 y.o. female here for ABDOMINAL PAIN.  Past Medical History  Diagnosis Date  . GERD (gastroesophageal reflux disease)     Hiatal hernia  . Hx of adenomatous colonic polyps   . Hyperlipidemia     H/o PVCs  . Goiter   . Degenerative joint disease     s/p right THA; left THA planned for 03/2012  . Spinal stenosis     CT in 07/2010: L3-4; degenerative joint disease at L4-5   Past Surgical History  Procedure Laterality Date  . Total hip arthroplasty  2001    Right  . Esophagogastroduodenoscopy  2005    hiatal hernia, GERD per patient  . Colonoscopy w/ polypectomy  12/14/2003    Dr. Patel-->1.5 cm size polyp with patchy whitish mucosa in sigmoid colon around base of polyp. Path-adenomatous polyp  . Knee surgery  03/04    right  . Colonoscopy  01/07/2001    hyperplastic polyp/tubulovillous adenoma with severe atypia  . Colonoscopy      + ileoscopy: per patient has additional one in 2006, 2007 and Flex sig in 2008. Told next colonoscopy in 2013.   Marland Kitchen Esophagogastroduodenoscopy  09/2000    Dr. Betti Cruz, minimal gastritis, bx no hpylori  . Colonoscopy  09/03/11    internal hemrrhoids/diverticulosis in the left colon/ TUBULAR ADENOMA of descending colon. random colon bx negative for microscopic colitis. next TCS 08/2016  . Esophagogastroduodenoscopy  09/03/11    small bowel bx negative for celiac, mild chronic inactive gastritis, no h.pylori.   . Total hip arthroplasty  2013    right  . Total hip arthroplasty  08/2012    left    Prior to Admission medications   Medication Sig Start Date End Date Taking? Authorizing Provider  aspirin 81 MG tablet Take 81 mg by mouth daily.   Yes Historical Provider, MD  cetirizine (ZYRTEC) 10 MG tablet Take 10 mg by mouth daily.   Yes Historical Provider, MD  Cholecalciferol (VITAMIN D3) 2000 UNITS TABS  Take 2,000 Units by mouth daily.   Yes Historical Provider, MD  Cholecalciferol (VITAMIN D3) 400 UNITS CAPS Take 1 capsule by mouth 2 (two) times daily.   Yes Historical Provider, MD  HYDROcodone-acetaminophen (VICODIN) 5-500 MG per tablet Take 1 tablet by mouth every 6 (six) hours as needed for pain. 06/28/13  Yes Mahala Menghini, PA-C  metoprolol tartrate (LOPRESSOR) 25 MG tablet Take 12.5 mg by mouth 2 (two) times daily.   Yes Historical Provider, MD  Omega-3 Fatty Acids (OMEGA-3 FISH OIL PO) Take 1,000 mg by mouth daily.   Yes Historical Provider, MD  omeprazole (PRILOSEC) 20 MG capsule 1 po every morning 30 minutes prior to your first meal. 10/10/12 10/10/13 Yes Mahala Menghini, PA-C  sertraline (ZOLOFT) 50 MG tablet Take 50 mg by mouth daily.   Yes Historical Provider, MD  triamcinolone ointment (KENALOG) 0.5 % Apply 1 application topically 2 (two) times daily as needed.    Yes Historical Provider, MD    Allergies as of 07/11/2013 - Review Complete 07/03/2013  Allergen Reaction Noted  . Bextra [valdecoxib] Nausea Only 08/24/2011  . Tramadol Nausea Only 08/24/2011    Family History  Problem Relation Age of Onset  . Colon cancer Neg Hx   . Breast cancer Neg Hx   . Diabetes Sister   . Hypertension Brother   .  Hypertension Mother   . Hyperlipidemia Mother   . Hyperlipidemia Brother   . Stroke Mother     CAD  . Diabetes Mother   . Hypertension Father     CAD  . Diabetes Father     History   Social History  . Marital Status: Single    Spouse Name: N/A    Number of Children: 1  . Years of Education: N/A   Occupational History  . 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 Smoking status: Never Smoker   . Smokeless tobacco: Not on file  . Alcohol Use: No  . Drug Use: No  . Sexual Activity: Not on file   Other Topics Concern  . Not on file   Social History Narrative  . No narrative on file    Review of Systems: See  HPI, otherwise negative ROS   Physical Exam: BP 120/83  Pulse 63  Temp(Src) 97.4 F (36.3 C) (Oral)  Resp 18  Ht 5\' 6"  (1.676 m)  Wt 212 lb (96.163 kg)  BMI 34.23 kg/m2  SpO2 99% General:   Alert,  pleasant and cooperative in NAD Head:  Normocephalic and atraumatic. Neck:  Supple; Lungs:  Clear throughout to auscultation.    Heart:  Regular rate and rhythm. Abdomen:  Soft, nontender and nondistended. Normal bowel sounds, without guarding, and without rebound.   Neurologic:  Alert and  oriented x4;  grossly normal neurologically.  Impression/Plan:    . ABDOMINAL PAIN  PLAN:  EGD TODAY

## 2013-08-09 ENCOUNTER — Telehealth: Payer: Self-pay | Admitting: Gastroenterology

## 2013-08-09 NOTE — Telephone Encounter (Signed)
Please call pt. HER stomach Bx shows mild gastritis.   CONTINUE OMEPRAZOLE.  TAKE 30 MINUTES PRIOR TO YOUR MEALS TWICE DAILY.  CONTINUE YOUR WEIGHT LOSS EFFORTS. LOSE 10 LBS.  STRICTLY FOLLOW A LOW FAT DIET.   Call in 3 weeks if HER symptoms are not improved.  FOLLOW UP July 2015 E30 ABD PAIN.

## 2013-08-09 NOTE — Telephone Encounter (Signed)
PT is aware of results.  

## 2013-08-10 ENCOUNTER — Encounter (HOSPITAL_COMMUNITY): Payer: Self-pay | Admitting: Gastroenterology

## 2013-08-14 NOTE — Telephone Encounter (Signed)
Pt has OV for 7/1 at 10 with AS

## 2013-11-29 ENCOUNTER — Ambulatory Visit: Payer: Medicare Other | Admitting: Gastroenterology

## 2013-12-27 ENCOUNTER — Encounter: Payer: Self-pay | Admitting: Gastroenterology

## 2013-12-27 ENCOUNTER — Ambulatory Visit (INDEPENDENT_AMBULATORY_CARE_PROVIDER_SITE_OTHER): Payer: Medicare Other | Admitting: Gastroenterology

## 2013-12-27 VITALS — BP 117/79 | HR 55 | Temp 97.5°F | Ht 66.0 in | Wt 204.0 lb

## 2013-12-27 DIAGNOSIS — R109 Unspecified abdominal pain: Secondary | ICD-10-CM

## 2013-12-27 NOTE — Assessment & Plan Note (Signed)
With overall negative EGD. Gallbladder remains in situ. Need to request any outside ultrasounds performed. CT unrevealing. Symptoms appear to be more musculoskeletal in nature, with exacerbation with movement. However, she does correlate some worsening with eating at times. Offered [place] +/-HIDA in near future; she will be seeing her orthopedist specialist next week and will discuss with him first. Patient to call back for further evaluation.

## 2013-12-27 NOTE — Progress Notes (Signed)
Referring Provider: Vesta Mixer Primary Care Physician:  Antionette Fairy, PA-C Primary GI: Dr. Oneida Alar   Chief Complaint  Patient presents with  . Follow-up    still has some abdominal pain    HPI:   Returns in follow-up after EGD performed for persistent RUQ/epigastric pain. CT overall negative. Constant RUQ pain, Prilosec BID helped quiet it down but still there. Sometimes worse with eating. No N/V. Gallbladder remains in situ. Dairy products bother her. Leaning forward causes it to hurt worse. Had some stress lately. Feels worse with stress. Trying to lose weight. Changing eating habits. Goes to see orthopedic on Wednesday, Aug 5th.   Past Medical History  Diagnosis Date  . GERD (gastroesophageal reflux disease)     Hiatal hernia  . Hx of adenomatous colonic polyps   . Hyperlipidemia     H/o PVCs  . Goiter   . Degenerative joint disease     s/p right THA; left THA planned for 03/2012  . Spinal stenosis     CT in 07/2010: L3-4; degenerative joint disease at L4-5    Past Surgical History  Procedure Laterality Date  . Total hip arthroplasty  2001    Right  . Esophagogastroduodenoscopy  2005    hiatal hernia, GERD per patient  . Colonoscopy w/ polypectomy  12/14/2003    Dr. Patel-->1.5 cm size polyp with patchy whitish mucosa in sigmoid colon around base of polyp. Path-adenomatous polyp  . Knee surgery  03/04    right  . Colonoscopy  01/07/2001    hyperplastic polyp/tubulovillous adenoma with severe atypia  . Colonoscopy      + ileoscopy: per patient has additional one in 2006, 2007 and Flex sig in 2008. Told next colonoscopy in 2013.   Marland Kitchen Esophagogastroduodenoscopy  09/2000    Dr. Betti Cruz, minimal gastritis, bx no hpylori  . Colonoscopy  09/03/11    internal hemrrhoids/diverticulosis in the left colon/ TUBULAR ADENOMA of descending colon. random colon bx negative for microscopic colitis. next TCS 08/2016  . Esophagogastroduodenoscopy  09/03/11    small bowel  bx negative for celiac, mild chronic inactive gastritis, no h.pylori.   . Total hip arthroplasty  2013    right  . Total hip arthroplasty  08/2012    left  . Esophagogastroduodenoscopy N/A 08/08/2013    Dr. Oneida Alar: gastritis, small hiatal hernia, multiple small sessile polyps  . Cubital tunnel repair      Current Outpatient Prescriptions  Medication Sig Dispense Refill  . aspirin 81 MG tablet Take 81 mg by mouth daily.      . cetirizine (ZYRTEC) 10 MG tablet Take 10 mg by mouth daily.      . Cholecalciferol (VITAMIN D3) 2000 UNITS TABS Take 2,000 Units by mouth daily.      Marland Kitchen HYDROcodone-acetaminophen (VICODIN) 5-500 MG per tablet Take 1 tablet by mouth every 6 (six) hours as needed for pain.  20 tablet  0  . metoprolol tartrate (LOPRESSOR) 25 MG tablet Take 12.5 mg by mouth 2 (two) times daily.      . Omega-3 Fatty Acids (OMEGA-3 FISH OIL PO) Take 1,000 mg by mouth daily.      Marland Kitchen omeprazole (PRILOSEC) 20 MG capsule 1 po every morning 30 minutes prior to your meals bid.  60 capsule  11  . pravastatin (PRAVACHOL) 20 MG tablet Take 20 mg by mouth daily.      . sertraline (ZOLOFT) 50 MG tablet Take 50 mg by mouth daily.  No current facility-administered medications for this visit.    Allergies as of 12/27/2013 - Review Complete 12/27/2013  Allergen Reaction Noted  . Bextra [valdecoxib] Nausea Only 08/24/2011  . Tramadol Nausea Only 08/24/2011    Family History  Problem Relation Age of Onset  . Colon cancer Neg Hx   . Breast cancer Neg Hx   . Diabetes Sister   . Hypertension Brother   . Hypertension Mother   . Hyperlipidemia Mother   . Hyperlipidemia Brother   . Stroke Mother     CAD  . Diabetes Mother   . Hypertension Father     CAD  . Diabetes Father     History   Social History  . Marital Status: Single    Spouse Name: N/A    Number of Children: 1  . Years of Education: N/A   Occupational History  . 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 Smoking status: Never Smoker   . Smokeless tobacco: None     Comment: Never smoked  . Alcohol Use: No  . Drug Use: No  . Sexual Activity: None   Other Topics Concern  . None   Social History Narrative  . None    Review of Systems: As mentioned in HPI  Physical Exam: BP 117/79  Pulse 55  Temp(Src) 97.5 F (36.4 C) (Oral)  Ht 5\' 6"  (1.676 m)  Wt 204 lb (92.534 kg)  BMI 32.94 kg/m2 General:   Alert and oriented. No distress noted. Pleasant and cooperative.  Head:  Normocephalic and atraumatic. Eyes:  Conjuctiva clear without scleral icterus. Heart:  S1, S2 present without murmurs, rubs, or gallops. Regular rate and rhythm. Abdomen:  +BS, soft, TTP right rib costal margin, RUQ, and epigastric region. non-distended. No rebound or guarding. No HSM or masses noted. Msk:  Symmetrical without gross deformities. Normal posture. Extremities:  Without edema. Neurologic:  Alert and  oriented x4;  grossly normal neurologically. Skin:  Intact without significant lesions or rashes. Psych:  Alert and cooperative. Normal mood and affect.

## 2013-12-27 NOTE — Patient Instructions (Signed)
Continue to take Prilosec twice a day as you are doing.  Please call me next week with an update. I would recommend an ultrasound of your belly to further evaluate your gallbladder. There is a chance this could be musculoskeletal as well.

## 2013-12-28 ENCOUNTER — Telehealth: Payer: Self-pay | Admitting: *Deleted

## 2013-12-28 NOTE — Progress Notes (Signed)
cc'd to pcp 

## 2013-12-28 NOTE — Telephone Encounter (Signed)
I faxed [place] records and there are no records of a abdomen ultrasound.

## 2014-01-16 ENCOUNTER — Telehealth: Payer: Self-pay | Admitting: *Deleted

## 2014-01-16 ENCOUNTER — Other Ambulatory Visit: Payer: Self-pay | Admitting: Gastroenterology

## 2014-01-16 DIAGNOSIS — R1011 Right upper quadrant pain: Secondary | ICD-10-CM

## 2014-01-16 NOTE — Telephone Encounter (Signed)
Abd U/S scheduled for 8/20 at 9:00 am and she is aware

## 2014-01-16 NOTE — Telephone Encounter (Signed)
Pt called stating when she was last seen by Laban Emperor, Vicente Males wanted her to have her gall bladder checked, per pt it was a ultra sound Vicente Males wanted. Pt has not heard anything else about this. Please advise [phone] pt will be home until 9:30.

## 2014-01-18 ENCOUNTER — Ambulatory Visit (HOSPITAL_COMMUNITY)
Admission: RE | Admit: 2014-01-18 | Discharge: 2014-01-18 | Disposition: A | Discharge status: Home / Self Care | Payer: Medicare Other | Source: Ambulatory Visit | Attending: Gastroenterology | Admitting: Gastroenterology

## 2014-01-18 DIAGNOSIS — R1011 Right upper quadrant pain: Secondary | ICD-10-CM | POA: Insufficient documentation

## 2014-01-18 DIAGNOSIS — K219 Gastro-esophageal reflux disease without esophagitis: Secondary | ICD-10-CM | POA: Insufficient documentation

## 2014-01-18 DIAGNOSIS — K7689 Other specified diseases of liver: Secondary | ICD-10-CM | POA: Insufficient documentation

## 2014-01-24 NOTE — Progress Notes (Signed)
Quick Note:  No gallstones. Small cyst that is unchanged from 07/2010 noted in liver.  If persistent RUQ pain, proceed with HIDA. ______

## 2014-01-25 ENCOUNTER — Telehealth: Payer: Self-pay | Admitting: Gastroenterology

## 2014-01-25 NOTE — Telephone Encounter (Signed)
Pt said that someone had called her earlier and couldn't remember who it was. She thinks its about her results. Please call her at [phone]

## 2014-01-26 NOTE — Telephone Encounter (Signed)
LMOM for pt to return call. See notes for results.

## 2014-01-29 ENCOUNTER — Other Ambulatory Visit: Payer: Self-pay | Admitting: Gastroenterology

## 2014-01-29 ENCOUNTER — Telehealth: Payer: Self-pay

## 2014-01-29 DIAGNOSIS — R1011 Right upper quadrant pain: Secondary | ICD-10-CM

## 2014-01-29 NOTE — Telephone Encounter (Signed)
PT called and is aware of results of her [place]. She is still having a lot of pain and it is OK to schedule HIDA. ( See note of 01/18/2014 ). Routing to Darius Bump to schedule.

## 2014-01-29 NOTE — Telephone Encounter (Signed)
HIDA is scheduled Tuesday Sept 8th at 8:00 am and Ruth Hughes is aware

## 2014-02-06 ENCOUNTER — Encounter (HOSPITAL_COMMUNITY): Payer: Self-pay

## 2014-02-06 ENCOUNTER — Ambulatory Visit (HOSPITAL_COMMUNITY)
Admission: RE | Admit: 2014-02-06 | Discharge: 2014-02-06 | Disposition: A | Discharge status: Home / Self Care | Payer: Medicare Other | Source: Ambulatory Visit | Attending: Gastroenterology | Admitting: Gastroenterology

## 2014-02-06 DIAGNOSIS — R1011 Right upper quadrant pain: Secondary | ICD-10-CM | POA: Insufficient documentation

## 2014-02-06 HISTORY — DX: Malignant (primary) neoplasm, unspecified: C80.1

## 2014-02-06 HISTORY — DX: Essential (primary) hypertension: I10

## 2014-02-06 MED ORDER — STERILE WATER FOR INJECTION IJ SOLN
INTRAMUSCULAR | Status: AC
  Administered 2014-02-06: 5 mL via INTRAVENOUS
  Filled 2014-02-06: qty 10

## 2014-02-06 MED ORDER — SINCALIDE 5 MCG IJ SOLR
INTRAMUSCULAR | Status: AC
  Administered 2014-02-06: 1.86 ug via INTRAVENOUS
  Filled 2014-02-06: qty 5

## 2014-02-06 MED ORDER — SODIUM CHLORIDE 0.9 % IJ SOLN
INTRAMUSCULAR | Status: AC
  Filled 2014-02-06: qty 36

## 2014-02-06 MED ORDER — TECHNETIUM TC 99M MEBROFENIN IV KIT
5.0000 | PACK | Freq: Once | INTRAVENOUS | Status: AC | PRN
  Administered 2014-02-06: 5 via INTRAVENOUS

## 2014-03-01 ENCOUNTER — Telehealth: Payer: Self-pay | Admitting: Gastroenterology

## 2014-03-01 NOTE — Telephone Encounter (Signed)
PATIENT RETURNING CALL TO DR. [place],  [place]

## 2014-03-01 NOTE — Progress Notes (Signed)
REVIEWED.  

## 2014-03-01 NOTE — Telephone Encounter (Signed)
Called patient TO DISCUSS RESULTS. LVM AT HOME-WORK/(HOME 434)-NOT VALID NUM,BERS. HER HIDA SCAN IS NORMAL. HER GB SHOWS NO SIGNS OF DISEASE. NO CLEAR EXPLANATION FOR HER ABDOMINAL PAIN HAS BEEN IDENTIFIED. SHE COULD HAVE BACTERIAL OVERGROWTH WHICH CAN CAUSE GAS AND BLOATING/PAIN. HER OPTIONS AT THIS POINT ARE TO: 1. TRY A PROBIOTIC DAILY FOR 3 MOS OR 2. SHE CAN A HYDROGEN BREATH TEST TO EVALUATE FOR SIBO. FOLLOW UP IN 1 YEAR.  OPV W/ [facility]F JAN 2016 E30 BLOATING/ABD PAIN.

## 2014-03-01 NOTE — Telephone Encounter (Signed)
Called and informed the pt. She will try the probiotic first and see how that does. She said to please fax the info to her PCP.

## 2014-03-11 NOTE — Telephone Encounter (Signed)
SEE TC OCT 1.

## 2014-04-10 ENCOUNTER — Encounter: Payer: Self-pay | Admitting: Gastroenterology

## 2014-06-20 ENCOUNTER — Ambulatory Visit: Payer: Medicare Other | Admitting: Gastroenterology

## 2014-07-25 ENCOUNTER — Ambulatory Visit: Payer: Medicare Other | Admitting: Gastroenterology

## 2014-08-08 ENCOUNTER — Ambulatory Visit (INDEPENDENT_AMBULATORY_CARE_PROVIDER_SITE_OTHER): Payer: Medicare Other | Admitting: Gastroenterology

## 2014-08-08 ENCOUNTER — Encounter: Payer: Self-pay | Admitting: Gastroenterology

## 2014-08-08 VITALS — BP 110/60 | HR 75 | Temp 97.5°F | Ht 66.0 in | Wt 202.4 lb

## 2014-08-08 DIAGNOSIS — R0789 Other chest pain: Secondary | ICD-10-CM | POA: Diagnosis not present

## 2014-08-08 DIAGNOSIS — K219 Gastro-esophageal reflux disease without esophagitis: Secondary | ICD-10-CM

## 2014-08-08 NOTE — Assessment & Plan Note (Signed)
ASYMPTOMATIC  CONTINUE OMEPRAZOLE.   LOW FAT DIET OPV IN 6 MOS

## 2014-08-08 NOTE — Progress Notes (Signed)
Subjective:    Patient ID: Ruth Hughes, female    DOB: Oct 22, 1955, 59 y.o.   MRN: [id number]  HPI R FLANK PAIN-TENDER TO TOUCH. PROBIOTICS HELPED AND PPI. HAD DIARRHEA FOR 1 DAY WITH COLD SYMPTOMS ON MAR 4. FEB 2016 TOOK ABX AND DIFLUCAN. BMs; DAILY AT 0930. RAE STINGING IN THE MID-UPPER ABDOMEN. CT/U/S 2015-NAIAP. EXERCISE A LOT: CHAIR AND WALKS. MORE NOTICEABLE AFTER SHE EXERCISING.  PT DENIES FEVER, CHILLS, HEMATOCHEZIA, HEMATEMESIS, nausea, vomiting, melena, CHEST PAIN, SHORTNESS OF BREATH,   constipation,  problems swallowing, problems with sedation, OR  heartburn or indigestion.   Past Medical History  Diagnosis Date  . GERD (gastroesophageal reflux disease)     Hiatal hernia  . Hx of adenomatous colonic polyps   . Hyperlipidemia     H/o PVCs  . Goiter   . Degenerative joint disease     s/p right THA; left THA planned for 03/2012  . Spinal stenosis     CT in 07/2010: L3-4; degenerative joint disease at L4-5  . Hypertension   . Cancer    Past Surgical History  Procedure Laterality Date  . Total hip arthroplasty  2001    Right  . Esophagogastroduodenoscopy  2005    hiatal hernia, GERD per patient  . Colonoscopy w/ polypectomy  12/14/2003    Dr. Patel-->1.5 cm size polyp with patchy whitish mucosa in sigmoid colon around base of polyp. Path-adenomatous polyp  . Knee surgery  03/04    right  . Colonoscopy  01/07/2001    hyperplastic polyp/tubulovillous adenoma with severe atypia  . Colonoscopy      + ileoscopy: per patient has additional one in 2006, 2007 and Flex sig in 2008. Told next colonoscopy in 2013.   Marland Kitchen Esophagogastroduodenoscopy  09/2000    Dr. Betti Cruz, minimal gastritis, bx no hpylori  . Colonoscopy  09/03/11    internal hemrrhoids/diverticulosis in the left colon/ TUBULAR ADENOMA of descending colon. random colon bx negative for microscopic colitis. next TCS 08/2016  . Esophagogastroduodenoscopy  09/03/11    small bowel bx negative for celiac, mild chronic  inactive gastritis, no h.pylori.   . Total hip arthroplasty  2013    right  . Total hip arthroplasty  08/2012    left  . Esophagogastroduodenoscopy N/A 08/08/2013    Dr. Oneida Alar: gastritis, small hiatal hernia, multiple small sessile polyps  . Cubital tunnel repair      Allergies  Allergen Reactions  . Bextra [Valdecoxib] Nausea Only  . Tramadol Nausea Only    Current Outpatient Prescriptions  Medication Sig Dispense Refill  . aspirin 81 MG tablet Take 81 mg by mouth daily.    . cetirizine (ZYRTEC) 10 MG tablet Take 10 mg by mouth daily.    . Cholecalciferol (VITAMIN D3) 2000 UNITS TABS Take 2,000 Units by mouth daily.    . metoprolol tartrate (LOPRESSOR) 25 MG tablet Take 12.5 mg by mouth 2 (two) times daily.    . Omega-3 Fatty Acids (OMEGA-3 FISH OIL PO) Take 1,000 mg by mouth daily.    Marland Kitchen omeprazole (PRILOSEC) 20 MG capsule 1 po every morning 30 minutes prior to your meals bid.    Marland Kitchen oxycodone (OXY-IR) 5 MG capsule Take 5 mg by mouth every 6 (six) hours as needed.    .      . sertraline (ZOLOFT) 50 MG tablet Take 50 mg by mouth daily.    . simvastatin (ZOCOR) 20 MG tablet DAILY    .  Review of Systems     Objective:   Physical Exam  Constitutional: She is oriented to person, place, and time. She appears well-developed and well-nourished. No distress.  HENT:  Head: Normocephalic and atraumatic.  Mouth/Throat: Oropharynx is clear and moist. No oropharyngeal exudate.  Eyes: Pupils are equal, round, and reactive to light. No scleral icterus.  Neck: Normal range of motion. Neck supple.  Cardiovascular: Normal rate, regular rhythm and normal heart sounds.   Pulmonary/Chest: Effort normal and breath sounds normal. No respiratory distress. She exhibits tenderness.  RIGHT T10-T12 RIBS & REPRODUCES COMPLAINT  Abdominal: Soft. Bowel sounds are normal. She exhibits no distension. There is no tenderness.  Musculoskeletal: She exhibits no edema.  Lymphadenopathy:    She has no  cervical adenopathy.  Neurological: She is alert and oriented to person, place, and time.  Psychiatric: She has a normal mood and affect.  Vitals reviewed.         Assessment & Plan:

## 2014-08-08 NOTE — Patient Instructions (Signed)
ADD IBUPROFEN OTC 2 THREE TIMES A DAY FOR 10 DAYS. TAKE WITH FOOD OR MILK. REPEAT ONCE OF YOUR SYMPTOMS DO NOT RESOLVED AFTER FIRST ROUND OF IBUPROFEN.  RUB ICE ON RIBS OR PUT AN ICE PACK ON YOUR RIBS  THREE TIMES A DAY AND AFTER EXERCISE FOR 10 MINS.  FOLLOW UP IN 6 MOS.

## 2014-08-08 NOTE — Assessment & Plan Note (Signed)
SX NOT IDEALLY CONTROLLED AND EXACERBATED BY EXERCISE.  ADD IBUPROFEN OTC 2 TID FOR 10 DAYS. MEDICATION WARNINGS DISCUSSED.  ICE RIBS THREE TIMES A DAY AND AFTER EXERCISE FOR 10 MINS.  FOLLOW UP IN 6 MOS.

## 2014-08-08 NOTE — Progress Notes (Signed)
ON RECALL LIST  °

## 2014-08-09 NOTE — Progress Notes (Signed)
cc'ed to pcp °

## 2014-08-31 ENCOUNTER — Other Ambulatory Visit: Payer: Self-pay | Admitting: Gastroenterology

## 2015-01-01 ENCOUNTER — Encounter: Payer: Self-pay | Admitting: Gastroenterology

## 2015-01-09 ENCOUNTER — Other Ambulatory Visit (HOSPITAL_COMMUNITY): Payer: Self-pay | Admitting: Orthopedic Surgery

## 2015-01-09 DIAGNOSIS — M546 Pain in thoracic spine: Secondary | ICD-10-CM

## 2015-01-23 ENCOUNTER — Ambulatory Visit (HOSPITAL_COMMUNITY)
Admission: RE | Admit: 2015-01-23 | Discharge: 2015-01-23 | Disposition: A | Discharge status: Home / Self Care | Payer: Medicare Other | Source: Ambulatory Visit | Attending: Orthopedic Surgery | Admitting: Orthopedic Surgery

## 2015-01-23 DIAGNOSIS — M4804 Spinal stenosis, thoracic region: Secondary | ICD-10-CM | POA: Diagnosis not present

## 2015-01-23 DIAGNOSIS — M546 Pain in thoracic spine: Secondary | ICD-10-CM

## 2015-01-23 DIAGNOSIS — M5124 Other intervertebral disc displacement, thoracic region: Secondary | ICD-10-CM | POA: Diagnosis not present

## 2015-01-23 DIAGNOSIS — R0781 Pleurodynia: Secondary | ICD-10-CM | POA: Diagnosis not present

## 2015-01-23 DIAGNOSIS — M5030 Other cervical disc degeneration, unspecified cervical region: Secondary | ICD-10-CM | POA: Insufficient documentation

## 2015-01-30 IMAGING — US US ABDOMEN LIMITED
1 series · 14 of 25 positions shown · non-contrast
Comparison: CT abdomen pelvis - 07/03/2013; 07/14/2010

CLINICAL DATA: Gradually worsening right upper quadrant abdominal
pain. History of hepatic cysts and GERD.

EXAM:
US ABDOMEN LIMITED - RIGHT UPPER QUADRANT

[Series 1: us abdomen limited · 0.21mm/px · 14 of 55 slices shown]
[im 1/55]
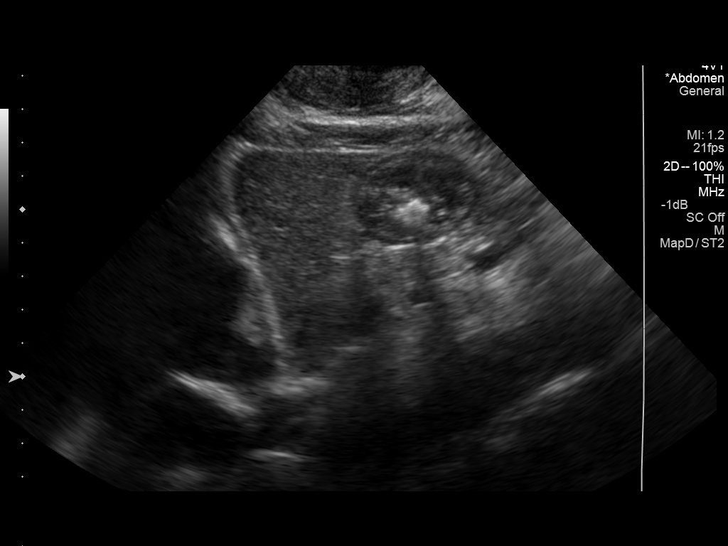
[im 5/55]
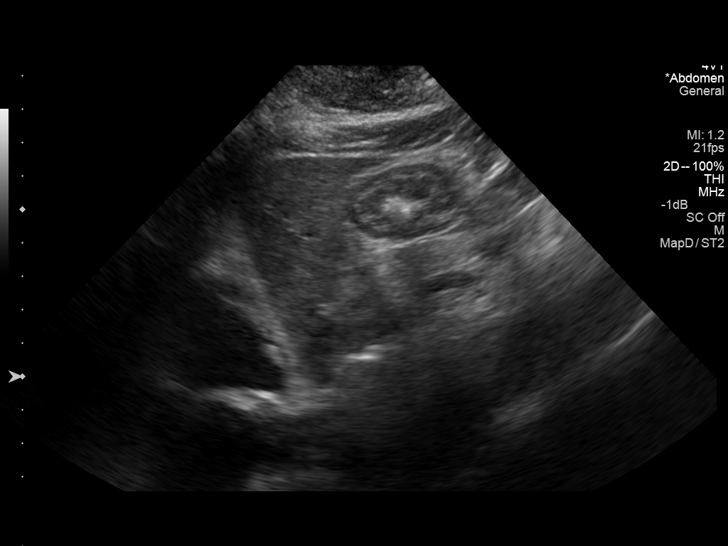
[im 10/55]
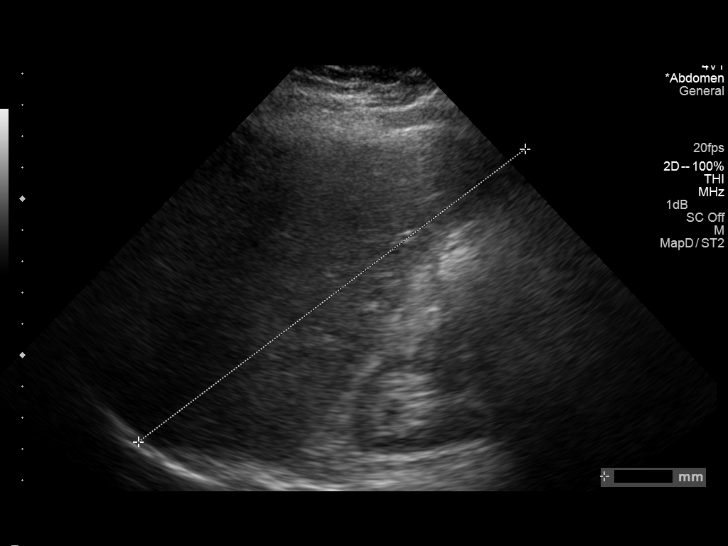
[im 14/55]
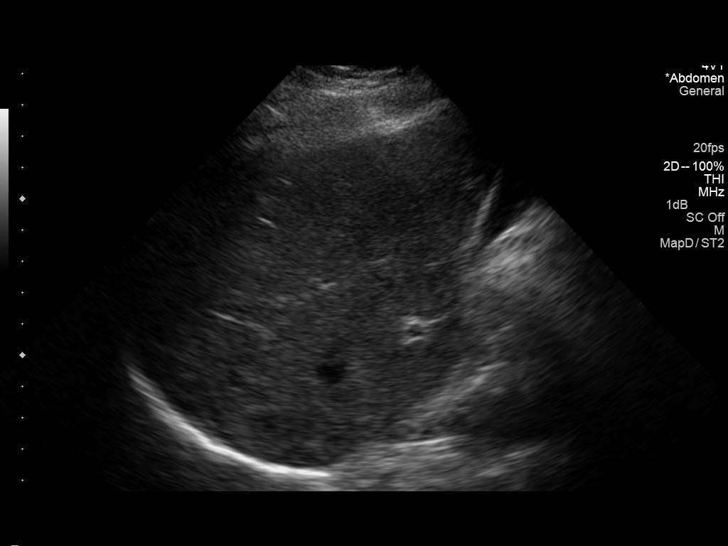
[im 19/55]
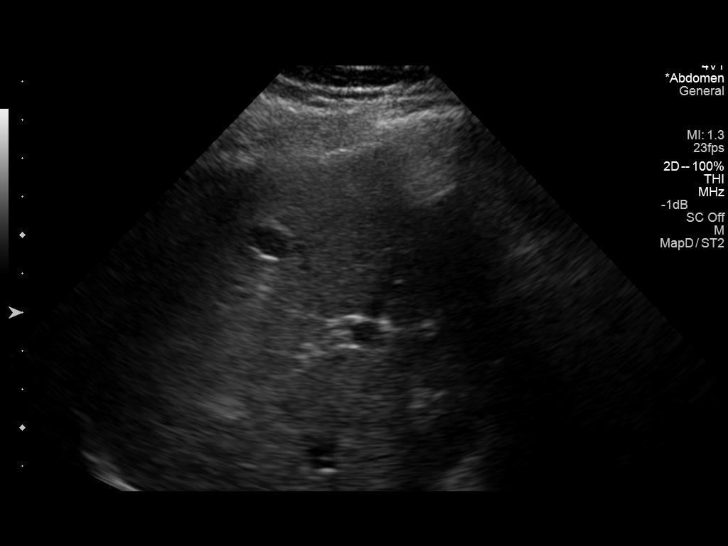
[im 21/55]
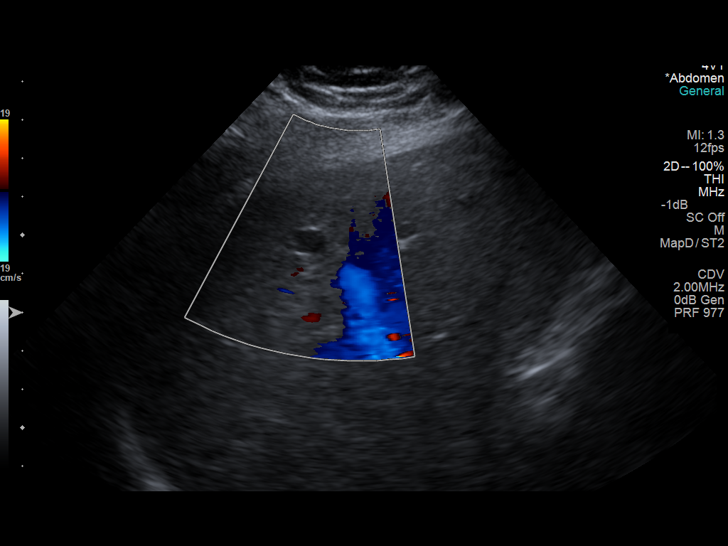
[im 25/55]
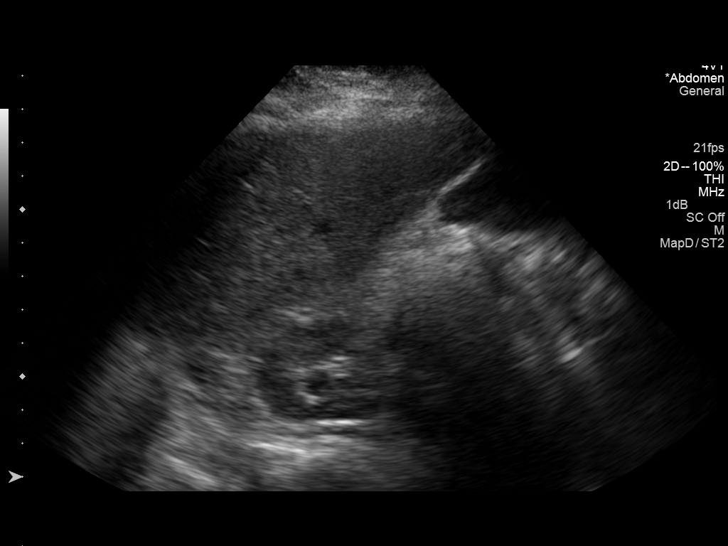
[im 30/55]
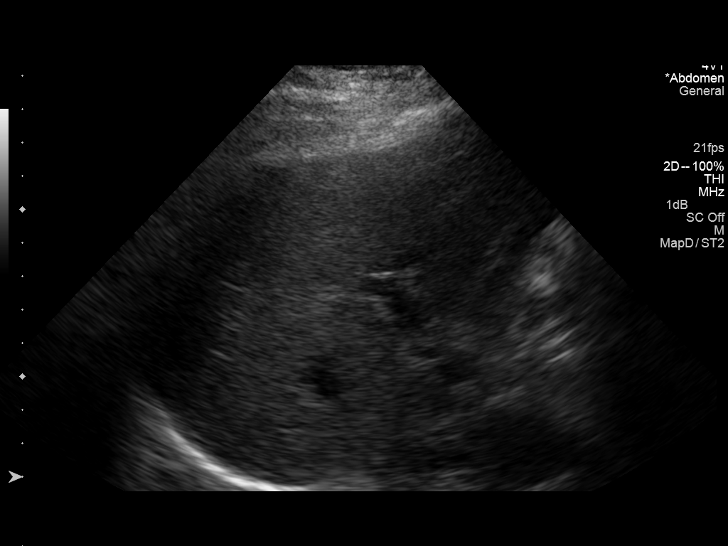
[im 34/55]
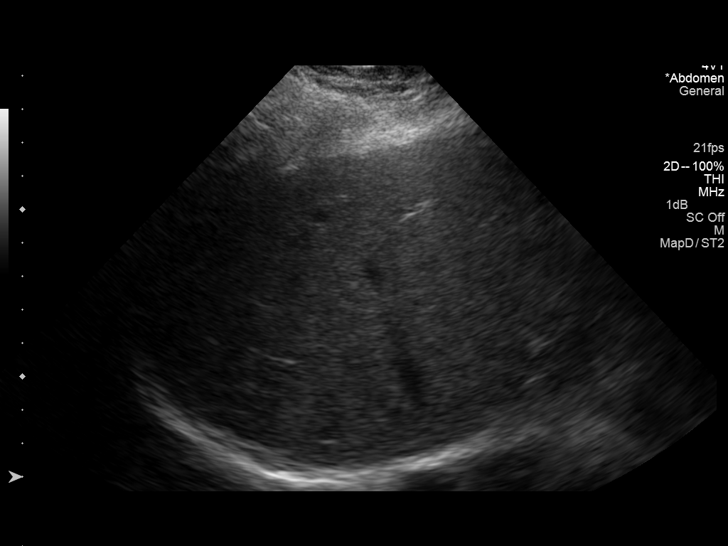
[im 37/55]
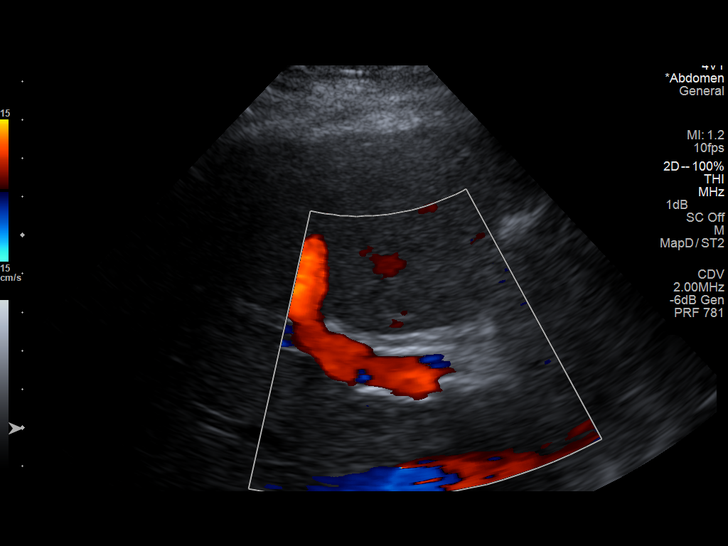
[im 41/55]
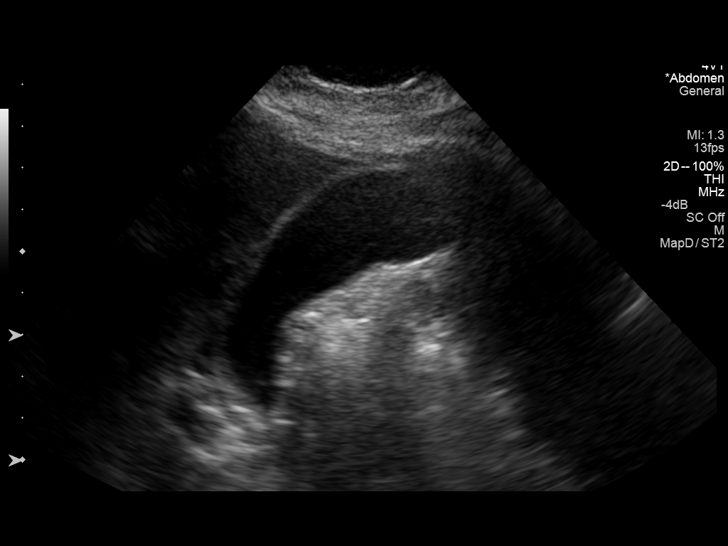
[im 46/55]
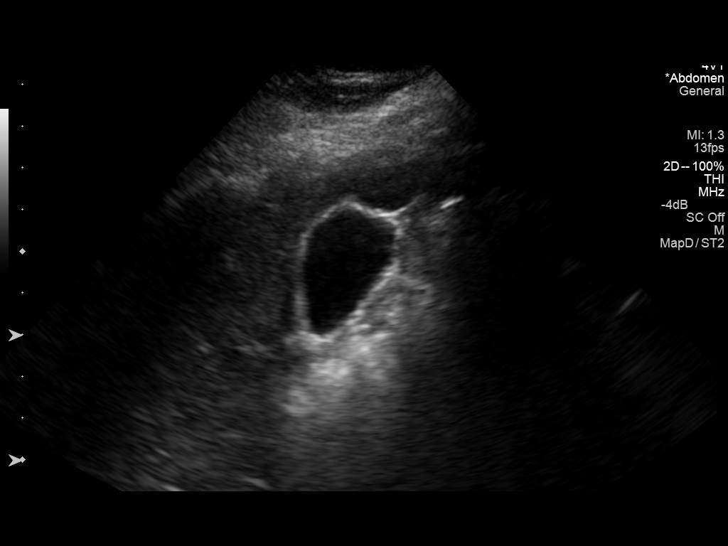
[im 50/55]
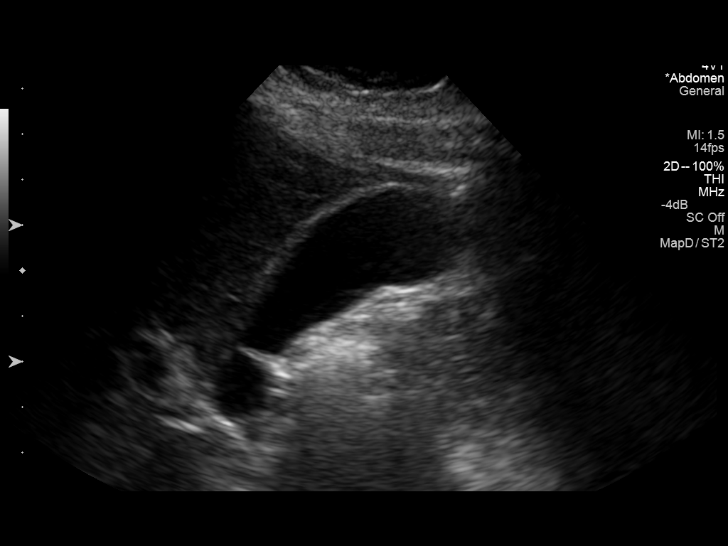
[im 55/55]
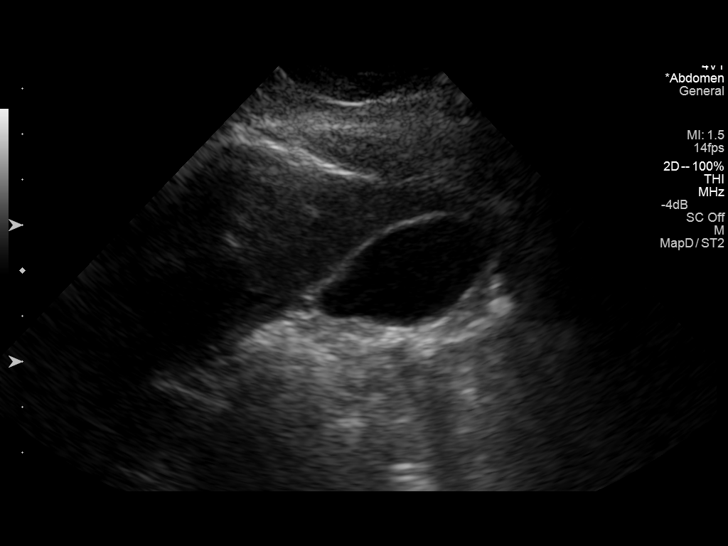

[14 of 25 positions shown; findings below may reference images not displayed]

FINDINGS: Gallbladder:

Sonographically normal. No echogenic gallstones or gall sludge. No
gallbladder wall thickening or pericholecystic fluid. Negative
sonographic Murphy's sign.

Common bile duct:

Diameter: Normal in size measuring 3 mm in diameter

Liver:

Note is made of an approximately 1.3 x 0.8 x 0.9 cm anechoic cyst
within the right lobe of the liver which demonstrates increased
through transmission, does not demonstrate internal blood flow and
appears similar to remote abdominal CT performed [DATE]. No
additional hepatic cyst lesions identified. No intrahepatic biliary
duct dilatation. No ascites.
IMPRESSION: 1. No explanation for patient's right upper quadrant abdominal pain.
Specifically, no evidence of cholelithiasis.
2. Incidental note made of an approximate 1.3 cm anechoic cyst
within the right lobe of the liver, similar to remote abdominal CT
performed [DATE].

## 2015-02-28 ENCOUNTER — Ambulatory Visit (INDEPENDENT_AMBULATORY_CARE_PROVIDER_SITE_OTHER): Payer: Medicare Other | Admitting: Gastroenterology

## 2015-02-28 ENCOUNTER — Encounter: Payer: Self-pay | Admitting: Gastroenterology

## 2015-02-28 VITALS — BP 122/76 | HR 57 | Temp 97.1°F | Ht 66.0 in | Wt 215.0 lb

## 2015-02-28 DIAGNOSIS — K219 Gastro-esophageal reflux disease without esophagitis: Secondary | ICD-10-CM | POA: Diagnosis not present

## 2015-02-28 DIAGNOSIS — R0789 Other chest pain: Secondary | ICD-10-CM

## 2015-02-28 NOTE — Progress Notes (Signed)
CC'D TO PCP °

## 2015-02-28 NOTE — Progress Notes (Signed)
ON RECALL AND CC'D TO PCP °

## 2015-02-28 NOTE — Assessment & Plan Note (Addendum)
SYMPTOMS FAIRLY WELL CONTROLLED.  DRINK WATER TO KEEP YOUR URINE LIGHT YELLOW. FOLLOW A HIGH FIBER/LOW FAT DIET. MEATS SHOULD BE BAKED, BROILED, OR BOILED. AVOID FRIED FODDS.  CONTINUE OMEPRAZOLE.  TAKE 30 MINUTES PRIOR TO YOUR MEALS TWICE DAILY. FOLLOW UP IN 6 MOS.  GREATER THAN 50% WAS SPENT IN COUNSELING & COORDINATION OF CARE WITH THE PATIENT: DISCUSSED BENEFITS OF WEIGHT LOSS, RISKS OF OBESITY, AND MANAGEMENT OF GERD/OBESITY. TOTAL ENCOUNTER TIME: 25 MINS.

## 2015-02-28 NOTE — Assessment & Plan Note (Signed)
SYMPTOMS FAIRLY WELL CONTROLLED.  CONTINUE TO MONITOR SYMPTOMS. 

## 2015-02-28 NOTE — Progress Notes (Signed)
Subjective:    Patient ID: Ruth Hughes, female    DOB: 1956-05-17, 59 y.o.   MRN: [id number] Neysa Hotter B, PA-C   HPI RECENTLY HAD EPIDURAL SPINAL INJECTION. TUES-DRANK POWDERED MILK AND HAD TERRIBLE ABDOMINAL CRAMPS AND LOOSE STOOLS AND SEVERE NAUSEA. NO VOMITING. ALMOND MILK DOESN'T BOTHER HER. STILL HAD MILD R SIDE CHEST PAIN. EATS AND HEARS HER BOWEL BUBBLE. BMs: EVERY DAY(SOFT). NO STRAINING.  PT DENIES FEVER, CHILLS, HEMATOCHEZIA, vomiting, melena, SHORTNESS OF BREATH,  CHANGE IN BOWEL IN HABITS, constipation, problems swallowing, OR  heartburn or indigestion.  Past Medical History  Diagnosis Date  . GERD (gastroesophageal reflux disease)     Hiatal hernia  . Hx of adenomatous colonic polyps   . Hyperlipidemia     H/o PVCs  . Goiter   . Degenerative joint disease     s/p right THA; left THA planned for 03/2012  . Spinal stenosis     CT in 07/2010: L3-4; degenerative joint disease at L4-5  . Hypertension   . Cancer    Past Surgical History  Procedure Laterality Date  . Total hip arthroplasty  2001    Right  . Esophagogastroduodenoscopy  2005    hiatal hernia, GERD per patient  . Colonoscopy w/ polypectomy  12/14/2003    Dr. Patel-->1.5 cm size polyp with patchy whitish mucosa in sigmoid colon around base of polyp. Path-adenomatous polyp  . Knee surgery  03/04    right  . Colonoscopy  01/07/2001    hyperplastic polyp/tubulovillous adenoma with severe atypia  . Colonoscopy      + ileoscopy: per patient has additional one in 2006, 2007 and Flex sig in 2008. Told next colonoscopy in 2013.   Marland Kitchen Esophagogastroduodenoscopy  09/2000    Dr. Betti Cruz, minimal gastritis, bx no hpylori  . Colonoscopy  09/03/11    internal hemrrhoids/diverticulosis in the left colon/ TUBULAR ADENOMA of descending colon. random colon bx negative for microscopic colitis. next TCS 08/2016  . Esophagogastroduodenoscopy  09/03/11    small bowel bx negative for celiac, mild chronic inactive gastritis,  no h.pylori.   . Total hip arthroplasty  2013    right  . Total hip arthroplasty  08/2012    left  . Esophagogastroduodenoscopy N/A 08/08/2013    Dr. Oneida Alar: gastritis, small hiatal hernia, multiple small sessile polyps  . Cubital tunnel repair      Allergies  Allergen Reactions  . Bextra [Valdecoxib] Nausea Only  . Tramadol Nausea Only    Current Outpatient Prescriptions  Medication Sig Dispense Refill  . aspirin 81 MG tablet Take 81 mg by mouth daily.    . cetirizine (ZYRTEC) 10 MG tablet Take 10 mg by mouth daily.    . Cholecalciferol (VITAMIN D3) 2000 UNITS TABS Take 2,000 Units by mouth daily.    . metoprolol tartrate (LOPRESSOR) 25 MG tablet Take 12.5 mg by mouth 2 (two) times daily.    . Omega-3 Fatty Acids (OMEGA-3 FISH OIL PO) Take 1,000 mg by mouth daily.    Marland Kitchen omeprazole (PRILOSEC) 20 MG capsule TAKE 1 CAPSULE TWICE DAILY 30 MINUTES BEFORE A MEAL MOST DAYS   . oxycodone (OXY-IR) 5 MG capsule Take 5 mg by mouth every 6 (six) hours as needed. 2X/WEEK   . sertraline (ZOLOFT) 50 MG tablet Take 50 mg by mouth daily.    . simvastatin (ZOCOR) 20 MG tablet     . triamcinolone cream (KENALOG) 0.5 % Frequency:BID   Dosage:0.0     Instructions:  Note:Dose:  0.5 %    PHILLIPS COLON HEALTH DAILY. Review of Systems PER HPI OTHERWISE ALL SYSTEMS ARE NEGATIVE.    Objective:   Physical Exam  Constitutional: She is oriented to person, place, and time. She appears well-developed and well-nourished. No distress.  HENT:  Head: Normocephalic and atraumatic.  Mouth/Throat: Oropharynx is clear and moist. No oropharyngeal exudate.  Eyes: Pupils are equal, round, and reactive to light. No scleral icterus.  Neck: Normal range of motion. Neck supple.  Cardiovascular: Normal rate, regular rhythm and normal heart sounds.   Pulmonary/Chest: Effort normal and breath sounds normal. No respiratory distress.  Abdominal: Soft. Bowel sounds are normal. She exhibits no distension. There is no tenderness.    Musculoskeletal: She exhibits no edema.  Lymphadenopathy:    She has no cervical adenopathy.  Neurological: She is alert and oriented to person, place, and time.  NO  NEW FOCAL DEFICITS   Psychiatric: She has a normal mood and affect.  Vitals reviewed.     Assessment & Plan:

## 2015-02-28 NOTE — Patient Instructions (Signed)
DRINK WATER TO KEEP YOUR URINE LIGHT YELLOW.  FOLLOW A HIGH FIBER/LOW FAT DIET. MEATS SHOULD BE BAKED, BROILED, OR BOILED. AVOID FRIED FODDS. SEE INFO BELOW.  TAKE PHILLIP'S COLON HEALTH DAILY.  FOLLOW UP IN 6 MOS. MERRY CHRISTMAS AND HAPPY NEW YEAR!  Low-Fat Diet BREADS, CEREALS, PASTA, RICE, DRIED PEAS, AND BEANS These products are high in carbohydrates and most are low in fat. Therefore, they can be increased in the diet as substitutes for fatty foods. They too, however, contain calories and should not be eaten in excess. Cereals can be eaten for snacks as well as for breakfast.   FRUITS AND VEGETABLES It is good to eat fruits and vegetables. Besides being sources of fiber, both are rich in vitamins and some minerals. They help you get the daily allowances of these nutrients. Fruits and vegetables can be used for snacks and desserts.  MEATS Limit lean meat, chicken, [place], and fish to no more than 6 ounces per day. Beef, Pork, and Lamb Use lean cuts of beef, pork, and lamb. Lean cuts include:  Extra-lean ground beef.  Arm roast.  Sirloin tip.  Center-cut ham.  Round steak.  Loin chops.  Rump roast.  Tenderloin.  Trim all fat off the outside of meats before cooking. It is not necessary to severely decrease the intake of red meat, but lean choices should be made. Lean meat is rich in protein and contains a highly absorbable form of iron. Premenopausal women, in particular, should avoid reducing lean red meat because this could increase the risk for low red blood cells (iron-deficiency anemia).  Chicken and [place] These are good sources of protein. The fat of poultry can be reduced by removing the skin and underlying fat layers before cooking. Chicken and [place] can be substituted for lean red meat in the diet. Poultry should not be fried or covered with high-fat sauces. Fish and Shellfish Fish is a good source of protein. Shellfish contain cholesterol, but they usually are low in  saturated fatty acids. The preparation of fish is important. Like chicken and [place], they should not be fried or covered with high-fat sauces. EGGS Egg whites contain no fat or cholesterol. They can be eaten often. Try 1 to 2 egg whites instead of whole eggs in recipes or use egg substitutes that do not contain yolk. MILK AND DAIRY PRODUCTS Use skim or 1% milk instead of 2% or whole milk. Decrease whole milk, natural, and processed cheeses. Use nonfat or low-fat (2%) cottage cheese or low-fat cheeses made from vegetable oils. Choose nonfat or low-fat (1 to 2%) yogurt. Experiment with evaporated skim milk in recipes that call for heavy cream. Substitute low-fat yogurt or low-fat cottage cheese for sour cream in dips and salad dressings. Have at least 2 servings of low-fat dairy products, such as 2 glasses of skim (or 1%) milk each day to help get your daily calcium intake. FATS AND OILS Reduce the total intake of fats, especially saturated fat. Butterfat, lard, and beef fats are high in saturated fat and cholesterol. These should be avoided as much as possible. Vegetable fats do not contain cholesterol, but certain vegetable fats, such as coconut oil, palm oil, and palm kernel oil are very high in saturated fats. These should be limited. These fats are often used in bakery goods, processed foods, popcorn, oils, and nondairy creamers. Vegetable shortenings and some peanut butters contain hydrogenated oils, which are also saturated fats. Read the labels on these foods and check for saturated vegetable  oils. Unsaturated vegetable oils and fats do not raise blood cholesterol. However, they should be limited because they are fats and are high in calories. Total fat should still be limited to 30% of your daily caloric intake. Desirable liquid vegetable oils are corn oil, cottonseed oil, olive oil, canola oil, safflower oil, soybean oil, and sunflower oil. Peanut oil is not as good, but small amounts are acceptable.  Buy a heart-healthy tub margarine that has no partially hydrogenated oils in the ingredients. Mayonnaise and salad dressings often are made from unsaturated fats, but they should also be limited because of their high calorie and fat content. Seeds, nuts, peanut butter, olives, and avocados are high in fat, but the fat is mainly the unsaturated type. These foods should be limited mainly to avoid excess calories and fat. OTHER EATING TIPS Snacks  Most sweets should be limited as snacks. They tend to be rich in calories and fats, and their caloric content outweighs their nutritional value. Some good choices in snacks are graham crackers, melba toast, soda crackers, bagels (no egg), English muffins, fruits, and vegetables. These snacks are preferable to snack crackers, [place] fries, TORTILLA CHIPS, and POTATO chips. Popcorn should be air-popped or cooked in small amounts of liquid vegetable oil. Desserts Eat fruit, low-fat yogurt, and fruit ices instead of pastries, cake, and cookies. Sherbet, angel food cake, gelatin dessert, frozen low-fat yogurt, or other frozen products that do not contain saturated fat (pure fruit juice bars, frozen ice pops) are also acceptable.  COOKING METHODS Choose those methods that use little or no fat. They include: Poaching.  Braising.  Steaming.  Grilling.  Baking.  Stir-frying.  Broiling.  Microwaving.  Foods can be cooked in a nonstick pan without added fat, or use a nonfat cooking spray in regular cookware. Limit fried foods and avoid frying in saturated fat. Add moisture to lean meats by using water, broth, cooking wines, and other nonfat or low-fat sauces along with the cooking methods mentioned above. Soups and stews should be chilled after cooking. The fat that forms on top after a few hours in the refrigerator should be skimmed off. When preparing meals, avoid using excess salt. Salt can contribute to raising blood pressure in some people.  EATING AWAY FROM  HOME Order entres, potatoes, and vegetables without sauces or butter. When meat exceeds the size of a deck of cards (3 to 4 ounces), the rest can be taken home for another meal. Choose vegetable or fruit salads and ask for low-calorie salad dressings to be served on the side. Use dressings sparingly. Limit high-fat toppings, such as bacon, crumbled eggs, cheese, sunflower seeds, and olives. Ask for heart-healthy tub margarine instead of butter.

## 2015-07-16 ENCOUNTER — Encounter: Payer: Self-pay | Admitting: Gastroenterology

## 2015-08-21 ENCOUNTER — Ambulatory Visit: Payer: Medicare Other | Admitting: Nurse Practitioner

## 2015-08-28 ENCOUNTER — Encounter: Payer: Self-pay | Admitting: Nurse Practitioner

## 2015-08-28 ENCOUNTER — Ambulatory Visit (INDEPENDENT_AMBULATORY_CARE_PROVIDER_SITE_OTHER): Payer: Medicare Other | Admitting: Nurse Practitioner

## 2015-08-28 VITALS — BP 145/84 | HR 57 | Temp 97.5°F | Ht 67.0 in | Wt 220.2 lb

## 2015-08-28 DIAGNOSIS — K219 Gastro-esophageal reflux disease without esophagitis: Secondary | ICD-10-CM | POA: Diagnosis not present

## 2015-08-28 NOTE — Progress Notes (Signed)
Referring Provider: Vesta Mixer Primary Care Physician:  Antionette Fairy, PA-C Primary GI:  Dr. Oneida Alar  Chief Complaint  Patient presents with  . Follow-up    HPI:   Ruth Hughes is a 60 y.o. female who presents for follow-up on GERD. Last seen in our office 02/28/2015 for GERD and right-sided chest wall pain. At that point her right-sided chest wall pain is under relatively good control, noted abdominal cramps, loose stools, nausea after drinking powdered milk but almond milk not bothersome, GERD symptoms relatively well controlled. Patient was advised to eat high-fiber low-fat diet, continue omeprazole twice daily, follow-up in 6 months.  Today she states she's doing well. GERD symptoms under control on PPI daily. Is also on probiotic. Rare breakthrough symptoms. Is on narcotic pain medication for her significant back pain, with some associated constipation, well managed with stool softener, prune juice, and fiber. Denies abdominal pain, N/V, hematochezia, melena. Denies chest pain, dyspnea, dizziness, lightheadedness, syncope, near syncope. Denies any other upper or lower GI symptoms.  Past Medical History  Diagnosis Date  . GERD (gastroesophageal reflux disease)     Hiatal hernia  . Hx of adenomatous colonic polyps   . Hyperlipidemia     H/o PVCs  . Goiter   . Degenerative joint disease     s/p right THA; left THA planned for 03/2012  . Spinal stenosis     CT in 07/2010: L3-4; degenerative joint disease at L4-5  . Hypertension   . Cancer [facility])     Past Surgical History  Procedure Laterality Date  . Total hip arthroplasty  2001    Right  . Esophagogastroduodenoscopy  2005    hiatal hernia, GERD per patient  . Colonoscopy w/ polypectomy  12/14/2003    Dr. Patel-->1.5 cm size polyp with patchy whitish mucosa in sigmoid colon around base of polyp. Path-adenomatous polyp  . Knee surgery  03/04    right  . Colonoscopy  01/07/2001    hyperplastic  polyp/tubulovillous adenoma with severe atypia  . Colonoscopy      + ileoscopy: per patient has additional one in 2006, 2007 and Flex sig in 2008. Told next colonoscopy in 2013.   Marland Kitchen Esophagogastroduodenoscopy  09/2000    Dr. Betti Cruz, minimal gastritis, bx no hpylori  . Colonoscopy  09/03/11    internal hemrrhoids/diverticulosis in the left colon/ TUBULAR ADENOMA of descending colon. random colon bx negative for microscopic colitis. next TCS 08/2016  . Esophagogastroduodenoscopy  09/03/11    small bowel bx negative for celiac, mild chronic inactive gastritis, no h.pylori.   . Total hip arthroplasty  2013    right  . Total hip arthroplasty  08/2012    left  . Esophagogastroduodenoscopy N/A 08/08/2013    Dr. Oneida Alar: gastritis, small hiatal hernia, multiple small sessile polyps  . Cubital tunnel repair      Current Outpatient Prescriptions  Medication Sig Dispense Refill  . aspirin 81 MG tablet Take 81 mg by mouth daily.    . cetirizine (ZYRTEC) 10 MG tablet Take 10 mg by mouth daily.    . Cholecalciferol (VITAMIN D3) 2000 UNITS TABS Take 2,000 Units by mouth daily.    . meloxicam (MOBIC) 15 MG tablet Take 15 mg by mouth daily.    . metoprolol tartrate (LOPRESSOR) 25 MG tablet Take 12.5 mg by mouth 2 (two) times daily.    . Omega-3 Fatty Acids (OMEGA-3 FISH OIL PO) Take 1,000 mg by mouth daily.    Marland Kitchen  omeprazole (PRILOSEC) 20 MG capsule TAKE 1 CAPSULE TWICE DAILY 30 MINUTES BEFORE A MEAL (Patient taking differently: one daily) 60 capsule 11  . oxycodone (OXY-IR) 5 MG capsule Take 5 mg by mouth every 6 (six) hours as needed.    . Probiotic Product ([place]) Take by mouth daily.    . sertraline (ZOLOFT) 50 MG tablet Take 50 mg by mouth daily.    . simvastatin (ZOCOR) 20 MG tablet      No current facility-administered medications for this visit.    Allergies as of 08/28/2015 - Review Complete 08/28/2015  Allergen Reaction Noted  . Bextra [valdecoxib] Nausea Only 08/24/2011  .  Tramadol Nausea Only 08/24/2011    Family History  Problem Relation Age of Onset  . Colon cancer Neg Hx   . Breast cancer Neg Hx   . Diabetes Sister   . Hypertension Brother   . Hypertension Mother   . Hyperlipidemia Mother   . Hyperlipidemia Brother   . Stroke Mother     CAD  . Diabetes Mother   . Hypertension Father     CAD  . Diabetes Father     Social History   Social History  . Marital Status: Single    Spouse Name: N/A  . Number of Children: 1  . Years of Education: N/A   Occupational History  . 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 Smoking status: Never Smoker   . Smokeless tobacco: None     Comment: Never smoked  . Alcohol Use: No  . Drug Use: No  . Sexual Activity: Not Asked   Other Topics Concern  . None   Social History Narrative    Review of Systems: 10-point ROS negative except as per HPI.    Physical Exam: BP 145/84 mmHg  Pulse 57  Temp(Src) 97.5 F (36.4 C) (Oral)  Ht 5\' 7"  (1.702 m)  Wt 220 lb 3.2 oz (99.882 kg)  BMI 34.48 kg/m2 General:   Alert and oriented. Pleasant and cooperative. Well-nourished and well-developed. Appears in pain. Ears:  Normal auditory acuity. Cardiovascular:  S1, S2 present without murmurs appreciated. Extremities without clubbing or edema. Respiratory:  Clear to auscultation bilaterally. No wheezes, rales, or rhonchi. No distress.  Gastrointestinal:  +BS, soft, non-tender and non-distended. No guarding or rebound. Rectal:  Deferred  Musculoskalatal:  Symmetrical without gross deformities. Walks with a hobbling gait due to back pain. Skin:  Intact without significant lesions or rashes. Neurologic:  Alert and oriented x4;  grossly normal neurologically. Psych:  Alert and cooperative. Normal mood and affect. Heme/Lymph/Immune: No excessive bruising noted.    08/28/2015 9:29 AM   Disclaimer: This note was dictated with voice recognition software. Similar  sounding words can inadvertently be transcribed and may not be corrected upon review.

## 2015-08-28 NOTE — Assessment & Plan Note (Signed)
Current symptoms very well controlled on once daily Prilosec. Recommend continue Prilosec, return for follow-up in one year. Continue take stool softener as needed for constipation related to narcotic pain medication.

## 2015-08-28 NOTE — Patient Instructions (Signed)
1. Keep taking Prilosec once daily as you have been. 2. Take the stool softener for constipation related to your back pain medication. 3. Return for follow-up in one year. 4. Call us if you have any worsening symptoms before then and we can schedule an appointment.

## 2015-08-28 NOTE — Progress Notes (Signed)
CC'D TO PCP °

## 2015-11-28 ENCOUNTER — Other Ambulatory Visit (HOSPITAL_COMMUNITY): Payer: Self-pay | Admitting: Physician Assistant

## 2015-11-28 ENCOUNTER — Other Ambulatory Visit: Payer: Self-pay | Admitting: Physician Assistant

## 2015-11-28 DIAGNOSIS — E049 Nontoxic goiter, unspecified: Secondary | ICD-10-CM

## 2015-11-29 ENCOUNTER — Ambulatory Visit (HOSPITAL_COMMUNITY)
Admission: RE | Admit: 2015-11-29 | Discharge: 2015-11-29 | Disposition: A | Discharge status: Home / Self Care | Payer: Medicare Other | Source: Ambulatory Visit | Attending: Physician Assistant | Admitting: Physician Assistant

## 2015-11-29 DIAGNOSIS — E049 Nontoxic goiter, unspecified: Secondary | ICD-10-CM

## 2015-11-29 DIAGNOSIS — E041 Nontoxic single thyroid nodule: Secondary | ICD-10-CM | POA: Insufficient documentation

## 2016-07-09 ENCOUNTER — Encounter: Payer: Self-pay | Admitting: Gastroenterology

## 2016-08-26 ENCOUNTER — Encounter: Payer: Self-pay | Admitting: Gastroenterology

## 2016-08-26 ENCOUNTER — Ambulatory Visit (INDEPENDENT_AMBULATORY_CARE_PROVIDER_SITE_OTHER): Payer: Medicare Other | Admitting: Gastroenterology

## 2016-08-26 DIAGNOSIS — R1013 Epigastric pain: Secondary | ICD-10-CM

## 2016-08-26 DIAGNOSIS — G8929 Other chronic pain: Secondary | ICD-10-CM

## 2016-08-26 NOTE — Assessment & Plan Note (Signed)
SYMPTOMS NOT CONTROLLED AND ASSOCIATED WITH ANOREXIA BUT NO WEIGHT LOSS. DIFFERENTIAL DIAGNOSIS INCLUDES: GASTRITIS, IBS-C. LESS LIKELY OCCULT MALIGNANCY OR BD STONES OR CHOLECYSTITIS  CHECK HFP/LIPASE ABD U/S DRINKL WATER EAT FIBER ADD METAMUCIL ONCE DAILY FOLLOW UP IN 6 MOS.

## 2016-08-26 NOTE — Patient Instructions (Signed)
COMPLETE LABS AND ULTRASOUND.   DRINK WATER TO KEEP YOUR URINE LIGHT YELLOW.  FOLLOW A HIGH FIBER DIET. AVOID ITEMS THAT CAUSE BLOATING & GAS. SEE INFO BELOW.  ADD METAMUCIL ONCE DAILY.  FOLLOW UP IN 6 MOS.  High-Fiber Diet A high-fiber diet changes your normal diet to include more whole grains, legumes, fruits, and vegetables. Changes in the diet involve replacing refined carbohydrates with unrefined foods. The calorie level of the diet is essentially unchanged. The Dietary Reference Intake (recommended amount) for adult males is 38 grams per day. For adult females, it is 25 grams per day. Pregnant and lactating women should consume 28 grams of fiber per day. Fiber is the intact part of a plant that is not broken down during digestion. Functional fiber is fiber that has been isolated from the plant to provide a beneficial effect in the body.  PURPOSE  Increase stool bulk.   Ease and regulate bowel movements.   Lower cholesterol.   REDUCE RISK OF COLON CANCER  INDICATIONS THAT YOU NEED MORE FIBER  Constipation and hemorrhoids.   Uncomplicated diverticulosis (intestine condition) and irritable bowel syndrome.   Weight management.   As a protective measure against hardening of the arteries (atherosclerosis), diabetes, and cancer.   GUIDELINES FOR INCREASING FIBER IN THE DIET  Start adding fiber to the diet slowly. A gradual increase of about 5 more grams (2 slices of whole-wheat bread, 2 servings of most fruits or vegetables, or 1 bowl of high-fiber cereal) per day is best. Too rapid an increase in fiber may result in constipation, flatulence, and bloating.   Drink enough water and fluids to keep your urine clear or pale yellow. Water, juice, or caffeine-free drinks are recommended. Not drinking enough fluid may cause constipation.   Eat a variety of high-fiber foods rather than one type of fiber.   Try to increase your intake of fiber through using high-fiber foods rather than  fiber pills or supplements that contain small amounts of fiber.   The goal is to change the types of food eaten. Do not supplement your present diet with high-fiber foods, but replace foods in your present diet.   INCLUDE A VARIETY OF FIBER SOURCES  Replace refined and processed grains with whole grains, canned fruits with fresh fruits, and incorporate other fiber sources. White rice, white breads, and most bakery goods contain little or no fiber.   Brown whole-grain rice, buckwheat oats, and many fruits and vegetables are all good sources of fiber. These include: broccoli, Brussels sprouts, cabbage, cauliflower, beets, sweet potatoes, white potatoes (skin on), carrots, tomatoes, eggplant, squash, berries, fresh fruits, and dried fruits.   Cereals appear to be the richest source of fiber. Cereal fiber is found in whole grains and bran. Bran is the fiber-rich outer coat of cereal grain, which is largely removed in refining. In whole-grain cereals, the bran remains. In breakfast cereals, the largest amount of fiber is found in those with "bran" in their names. The fiber content is sometimes indicated on the label.   You may need to include additional fruits and vegetables each day.   In baking, for 1 cup white flour, you may use the following substitutions:   1 cup whole-wheat flour minus 2 tablespoons.   1/2 cup white flour plus 1/2 cup whole-wheat flour.

## 2016-08-26 NOTE — Progress Notes (Signed)
ON RECALL  °

## 2016-08-26 NOTE — Progress Notes (Signed)
cc'ed to pcp °

## 2016-08-26 NOTE — Progress Notes (Signed)
Subjective:    Patient ID: Ruth Hughes, female    DOB: 05/02/1956, 61 y.o.   MRN: [id number]  Neysa Hotter B, PA-C  HPI Pain in upper stomach. Pain all the time. Better: none WORSE: STAYS SAME. FOOD DOESN'T CHANGE IT. MAY BE SHARP. START SIN MIDDLE AND MOVES TO LEFT. HAD FOR A FEW MOS. PRECIPITATING FACTOR: WOKE UP WITH IT. GETTING WORSE: FREQUENCY. WAS OFF AND ON A ND NOW IT'S CONSTANT. APPETITE: EATS LESS. WEIGHT LOSS: 5 LBS?Marland Kitchen HANDS BE COLD. NAUSEA: 1X/WEEK. BMs: #4 AND FEELS LIKE DOESN'T EMPTY RECTUM. RARE INDIGESTION: FULLNESS. TAKING MELOXICAM PRN(x 6 MOS OR LONGER). DOESN'T TAKE FIBER SUPPLEMENTS OR STOOL SOFTENER. R SIDE PAIN BETTER AFTER BACK SURGERY BUT NOT RESOLVED.  PT DENIES FEVER, CHILLS, HEMATOCHEZIA, HEMATEMESIS, vomiting, melena, diarrhea, CHEST PAIN, SHORTNESS OF BREATH,  CHANGE IN BOWEL IN HABITS, constipation, problems swallowing, OR  heartburn.  Past Medical History:  Diagnosis Date  . Cancer ([place])   . Degenerative joint disease    s/p right THA; left THA planned for 03/2012  . GERD (gastroesophageal reflux disease)    Hiatal hernia  . Goiter   . Hx of adenomatous colonic polyps   . Hyperlipidemia    H/o PVCs  . Hypertension   . Spinal stenosis    CT in 07/2010: L3-4; degenerative joint disease at L4-5   Past Surgical History:  Procedure Laterality Date  . COLONOSCOPY  01/07/2001   hyperplastic polyp/tubulovillous adenoma with severe atypia  . COLONOSCOPY     + ileoscopy: per patient has additional one in 2006, 2007 and Flex sig in 2008. Told next colonoscopy in 2013.   Marland Kitchen COLONOSCOPY  09/03/11   internal hemrrhoids/diverticulosis in the left colon/ TUBULAR ADENOMA of descending colon. random colon bx negative for microscopic colitis. next TCS 08/2016  . COLONOSCOPY W/ POLYPECTOMY  12/14/2003   Dr. Patel-->1.5 cm size polyp with patchy whitish mucosa in sigmoid colon around base of polyp. Path-adenomatous polyp  . cubital tunnel repair    .  ESOPHAGOGASTRODUODENOSCOPY  2005   hiatal hernia, GERD per patient  . ESOPHAGOGASTRODUODENOSCOPY  09/2000   Dr. Betti Cruz, minimal gastritis, bx no hpylori  . ESOPHAGOGASTRODUODENOSCOPY  09/03/11   small bowel bx negative for celiac, mild chronic inactive gastritis, no h.pylori.   . ESOPHAGOGASTRODUODENOSCOPY N/A 08/08/2013   Dr. Oneida Alar: gastritis, small hiatal hernia, multiple small sessile polyps  . KNEE SURGERY  03/04   right  . TOTAL HIP ARTHROPLASTY  2001   Right  . TOTAL HIP ARTHROPLASTY  2013   right  . TOTAL HIP ARTHROPLASTY  08/2012   left   Allergies  Allergen Reactions  . Bextra [Valdecoxib] Nausea Only  . Tramadol Nausea Only   Current Outpatient Prescriptions  Medication Sig Dispense Refill  . aspirin 81 MG tablet Take 81 mg daily.    . cetirizine (ZYRTEC) 10 MG tablet Take 10 mg by mouth daily.    . Cholecalciferol (VITAMIN D3) 2000 UNITS TABS Take 2,000 Units by mouth daily.    . meloxicam (MOBIC) 15 MG tablet Take 15 mg by mouth as needed.     . metoprolol tartrate (LOPRESSOR) 25 MG tablet Take 12.5 mg by mouth 2 (two) times daily.    . Omega-3 Fatty Acids (OMEGA-3 FISH OIL PO) Take 1,000 mg by mouth daily.    . Omeprazole 20 MG capsule TAKE 1 30 MINS daily    . Probiotic Product ([place]) Take by mouth daily.    . sertraline (  ZOLOFT) 50 MG tablet Take 50 mg by mouth daily.    . simvastatin (ZOCOR) 20 MG tablet Take 20 mg by mouth daily.     .       Review of Systems PER HPI OTHERWISE ALL SYSTEMS ARE NEGATIVE.    Objective:   Physical Exam  Constitutional: She is oriented to person, place, and time. She appears well-developed and well-nourished. No distress.  HENT:  Head: Normocephalic and atraumatic.  Mouth/Throat: Oropharynx is clear and moist. No oropharyngeal exudate.  Eyes: Pupils are equal, round, and reactive to light. No scleral icterus.  Neck: Normal range of motion. Neck supple.  Cardiovascular: Normal rate, regular rhythm and  normal heart sounds.   Pulmonary/Chest: Effort normal and breath sounds normal. No respiratory distress.  Abdominal: Soft. Bowel sounds are normal. She exhibits no distension. There is tenderness. There is no rebound and no guarding.  MILD TTP IN THE EPIGASTRIUM/RUQ/RLQ  Musculoskeletal: She exhibits no edema.  Lymphadenopathy:    She has no cervical adenopathy.  Neurological: She is alert and oriented to person, place, and time.  NO  NEW FOCAL DEFICITS  Psychiatric:  ANXIOUS MOOD, FLAT AFFECT  Vitals reviewed.     Assessment & Plan:

## 2016-08-31 ENCOUNTER — Other Ambulatory Visit: Payer: Self-pay

## 2016-08-31 ENCOUNTER — Ambulatory Visit (HOSPITAL_COMMUNITY)
Admission: RE | Admit: 2016-08-31 | Discharge: 2016-08-31 | Disposition: A | Discharge status: Home / Self Care | Payer: Medicare Other | Source: Ambulatory Visit | Attending: Gastroenterology | Admitting: Gastroenterology

## 2016-08-31 DIAGNOSIS — G8929 Other chronic pain: Secondary | ICD-10-CM

## 2016-08-31 DIAGNOSIS — R1013 Epigastric pain: Principal | ICD-10-CM

## 2016-08-31 NOTE — Progress Notes (Signed)
Noted  

## 2016-09-01 LAB — HEPATIC FUNCTION PANEL
ALBUMIN: 4 g/dL (ref 3.6–5.1)
ALK PHOS: 73 U/L (ref 33–130)
ALT: 8 U/L (ref 6–29)
AST: 11 U/L (ref 10–35)
BILIRUBIN TOTAL: 1.2 mg/dL (ref 0.2–1.2)
Bilirubin, Direct: 0.2 mg/dL (ref <=0.2)
Indirect Bilirubin: 1 mg/dL (ref 0.2–1.2)
TOTAL PROTEIN: 6.5 g/dL (ref 6.1–8.1)

## 2016-09-01 LAB — LIPASE: LIPASE: 12 U/L (ref 7–60)

## 2016-09-10 ENCOUNTER — Ambulatory Visit (HOSPITAL_COMMUNITY)
Admission: RE | Admit: 2016-09-10 | Discharge: 2016-09-10 | Disposition: A | Discharge status: Home / Self Care | Payer: Medicare Other | Source: Ambulatory Visit | Attending: Gastroenterology | Admitting: Gastroenterology

## 2016-09-10 ENCOUNTER — Encounter (HOSPITAL_COMMUNITY): Payer: Self-pay

## 2016-09-10 ENCOUNTER — Ambulatory Visit (HOSPITAL_COMMUNITY): Payer: Medicare Other

## 2016-09-10 DIAGNOSIS — R1013 Epigastric pain: Secondary | ICD-10-CM | POA: Insufficient documentation

## 2016-09-10 DIAGNOSIS — G8929 Other chronic pain: Secondary | ICD-10-CM | POA: Diagnosis present

## 2016-09-10 DIAGNOSIS — I700 Atherosclerosis of aorta: Secondary | ICD-10-CM | POA: Insufficient documentation

## 2016-09-10 LAB — POCT I-STAT CREATININE: Creatinine, Ser: 0.7 mg/dL (ref 0.44–1.00)

## 2016-09-10 MED ORDER — IOPAMIDOL (ISOVUE-300) INJECTION 61%
100.0000 mL | Freq: Once | INTRAVENOUS | Status: AC | PRN
  Administered 2016-09-10: 100 mL via INTRAVENOUS

## 2016-09-11 ENCOUNTER — Telehealth: Payer: Self-pay | Admitting: Gastroenterology

## 2016-09-11 MED ORDER — PANTOPRAZOLE SODIUM 40 MG PO TBEC: DELAYED_RELEASE_TABLET | ORAL | 11 refills | Status: DC

## 2016-09-11 NOTE — Telephone Encounter (Signed)
HER CT SHOWS NO ACUTE FINDINGS. HER ABDOMINAL PAIN IS MOST LIKELY DUE TO USING NSAIDS. IF YOU TAKE MEXOLICAM AND ASA WITHOUT PROTECTING YOUR STOMACH IT WILL CAUSE GASTRITIS/DUODENITIS. BOTH CAUSE UPPER ABDOMINAL PAIN AND NAUSEA. STOP OMEPRAZOLE. START PROTONIX 40 MG BID FOR ONE MO THEN ONCE DALY FOREVER.

## 2016-09-14 ENCOUNTER — Telehealth: Payer: Self-pay | Admitting: Gastroenterology

## 2016-09-14 NOTE — Progress Notes (Signed)
Pt is aware.  

## 2016-09-14 NOTE — Telephone Encounter (Signed)
Pt was returning DS call. She wanted to know if she still needs to take metucil. [phone]

## 2016-09-14 NOTE — Telephone Encounter (Signed)
Pt is aware to take the Metamucil.

## 2016-11-03 ENCOUNTER — Encounter: Payer: Self-pay | Admitting: Gastroenterology

## 2017-01-06 ENCOUNTER — Other Ambulatory Visit: Payer: Self-pay

## 2017-01-06 ENCOUNTER — Ambulatory Visit (INDEPENDENT_AMBULATORY_CARE_PROVIDER_SITE_OTHER): Payer: Medicare Other | Admitting: Gastroenterology

## 2017-01-06 ENCOUNTER — Encounter: Payer: Self-pay | Admitting: Gastroenterology

## 2017-01-06 VITALS — BP 116/73 | HR 59 | Temp 97.8°F | Ht 67.0 in | Wt 219.6 lb

## 2017-01-06 DIAGNOSIS — K219 Gastro-esophageal reflux disease without esophagitis: Secondary | ICD-10-CM

## 2017-01-06 DIAGNOSIS — Z86010 Personal history of colonic polyps: Secondary | ICD-10-CM | POA: Diagnosis not present

## 2017-01-06 MED ORDER — NA SULFATE-K SULFATE-MG SULF 17.5-3.13-1.6 GM/177ML PO SOLN: 1.0000 | ORAL | 0 refills | Status: DC

## 2017-01-06 NOTE — Patient Instructions (Signed)
Continue Protonix once each morning, 30 minutes before breakfast.   We have scheduled you for a colonoscopy with Dr. Oneida Alar in the near future.  We will see you in 6 months.

## 2017-01-06 NOTE — Progress Notes (Addendum)
REVIEWED-NO ADDITIONAL RECOMMENDATIONS.  Referring Provider: Vesta Mixer Primary Care Physician:  Antionette Fairy, PA-C Primary GI: Dr. Oneida Alar   Chief Complaint  Patient presents with  . Colonoscopy    HPI:   Ruth Hughes is a 61 y.o. female presenting today with a history of GERD, gastritis. History of adenomas, surveillance colonoscopy due this year.   Protonix once daily. Dyspepsia resolved. Taking Metamucil. No constipation. No rectal bleeding. Rare mid-abdominal discomfort. Meloxicam possibly just once a week. No dysphagia.   Past Medical History:  Diagnosis Date  . Cancer ([place])   . Degenerative joint disease    s/p right THA; left THA planned for 03/2012  . GERD (gastroesophageal reflux disease)    Hiatal hernia  . Goiter   . Hx of adenomatous colonic polyps   . Hyperlipidemia    H/o PVCs  . Hypertension   . Spinal stenosis    CT in 07/2010: L3-4; degenerative joint disease at L4-5    Past Surgical History:  Procedure Laterality Date  . COLONOSCOPY  01/07/2001   hyperplastic polyp/tubulovillous adenoma with severe atypia  . COLONOSCOPY     + ileoscopy: per patient has additional one in 2006, 2007 and Flex sig in 2008. Told next colonoscopy in 2013.   Marland Kitchen COLONOSCOPY  09/03/11   internal hemrrhoids/diverticulosis in the left colon/ TUBULAR ADENOMA of descending colon. random colon bx negative for microscopic colitis. next TCS 08/2016  . COLONOSCOPY W/ POLYPECTOMY  12/14/2003   Dr. Patel-->1.5 cm size polyp with patchy whitish mucosa in sigmoid colon around base of polyp. Path-adenomatous polyp  . cubital tunnel repair    . ESOPHAGOGASTRODUODENOSCOPY  2005   hiatal hernia, GERD per patient  . ESOPHAGOGASTRODUODENOSCOPY  09/2000   Dr. Betti Cruz, minimal gastritis, bx no hpylori  . ESOPHAGOGASTRODUODENOSCOPY  09/03/11   small bowel bx negative for celiac, mild chronic inactive gastritis, no h.pylori.   . ESOPHAGOGASTRODUODENOSCOPY N/A 08/08/2013   Dr. Oneida Alar:  gastritis, small hiatal hernia, multiple small sessile polyps  . KNEE SURGERY  03/04   right  . TOTAL HIP ARTHROPLASTY  2001   Right  . TOTAL HIP ARTHROPLASTY  2013   right  . TOTAL HIP ARTHROPLASTY  08/2012   left    Current Outpatient Prescriptions  Medication Sig Dispense Refill  . aspirin 81 MG tablet Take 81 mg by mouth daily.    . cetirizine (ZYRTEC) 10 MG tablet Take 10 mg by mouth daily.    . Cholecalciferol (VITAMIN D3) 2000 UNITS TABS Take 2,000 Units by mouth daily.    . meloxicam (MOBIC) 15 MG tablet Take 15 mg by mouth as needed.     . metoprolol tartrate (LOPRESSOR) 25 MG tablet Take 12.5 mg by mouth 2 (two) times daily.    . Omega-3 Fatty Acids (OMEGA-3 FISH OIL PO) Take 1,000 mg by mouth daily.    . pantoprazole (PROTONIX) 40 MG tablet 1 PO 30 MINUTES PRIOR TO MEALS BID FOR 1 MOS THEN QD 60 tablet 11  . Probiotic Product ([place]) Take by mouth daily.    . sertraline (ZOLOFT) 50 MG tablet Take 50 mg by mouth daily.    . simvastatin (ZOCOR) 20 MG tablet Take 20 mg by mouth daily.      No current facility-administered medications for this visit.     Allergies as of 01/06/2017 - Review Complete 01/06/2017  Allergen Reaction Noted  . Bextra [valdecoxib] Nausea Only 08/24/2011  . Tramadol Nausea Only 08/24/2011  Family History  Problem Relation Age of Onset  . Colon cancer Neg Hx   . Breast cancer Neg Hx   . Diabetes Sister   . Hypertension Brother   . Hypertension Mother   . Hyperlipidemia Mother   . Hyperlipidemia Brother   . Stroke Mother        CAD  . Diabetes Mother   . Hypertension Father        CAD  . Diabetes Father     Social History   Social History  . Marital status: Single    Spouse name: N/A  . Number of children: 1  . Years of education: N/A   Occupational History  . 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 Smoking status: Never Smoker  . Smokeless tobacco:  Never Used     Comment: Never smoked  . Alcohol use No  . Drug use: No  . Sexual activity: Not on file   Other Topics Concern  . Not on file   Social History Narrative  . No narrative on file    Review of Systems: Gen: Denies fever, chills, anorexia. Denies fatigue, weakness, weight loss.  CV: Denies chest pain, palpitations, syncope, peripheral edema, and claudication. Resp: Denies dyspnea at rest, cough, wheezing, coughing up blood, and pleurisy. GI: see HPI  Derm: Denies rash, itching, dry skin Psych: Denies depression, anxiety, memory loss, confusion. No homicidal or suicidal ideation.  Heme: Denies bruising, bleeding, and enlarged lymph nodes.  Physical Exam: BP 116/73   Pulse (!) 59   Temp 97.8 F (36.6 C) (Oral)   Ht 5\' 7"  (1.702 m)   Wt 219 lb 9.6 oz (99.6 kg)   BMI 34.39 kg/m  General:   Alert and oriented. No distress noted. Pleasant and cooperative.  Head:  Normocephalic and atraumatic. Eyes:  Conjuctiva clear without scleral icterus. Mouth:  Oral mucosa pink and moist. Good dentition. No lesions. Neck:  Supple, without mass or thyromegaly. Heart:  S1, S2 present without murmurs, rubs, or gallops. Regular rate and rhythm. Abdomen:  +BS, soft, non-tender and non-distended. No rebound or guarding. No HSM or masses noted. Msk:  Symmetrical without gross deformities. Normal posture. Pulses:  2+ DP noted bilaterally Extremities:  Without edema. Neurologic:  Alert and  oriented x4;  grossly normal neurologically. Skin:  Intact without significant lesions or rashes. Cervical Nodes:  No significant cervical adenopathy. Psych:  Alert and cooperative. Normal mood and affect.

## 2017-01-08 NOTE — Assessment & Plan Note (Signed)
Doing well with Protonix once daily. Dyspepsia resolved. Return in 6 months. No concerning upper GI symptoms.

## 2017-01-08 NOTE — Assessment & Plan Note (Signed)
61 year old very pleasant female due for surveillance colonoscopy now. No concerning lower GI symptoms.  Proceed with colonoscopy with Dr. Oneida Alar in the near future. The risks, benefits, and alternatives have been discussed in detail with the patient. They state understanding and desire to proceed.  Phenergan 12.5 mg IV on call

## 2017-01-11 NOTE — Progress Notes (Signed)
cc'ed to pcp °

## 2017-02-25 ENCOUNTER — Encounter (HOSPITAL_COMMUNITY)
Admission: RE | Disposition: A | Discharge status: Home / Self Care | Payer: Self-pay | Source: Ambulatory Visit | Attending: Gastroenterology

## 2017-02-25 ENCOUNTER — Ambulatory Visit (HOSPITAL_COMMUNITY)
Admission: RE | Admit: 2017-02-25 | Discharge: 2017-02-25 | Disposition: A | Discharge status: Home / Self Care | Payer: Medicare Other | Source: Ambulatory Visit | Attending: Gastroenterology | Admitting: Gastroenterology

## 2017-02-25 ENCOUNTER — Encounter (HOSPITAL_COMMUNITY): Payer: Self-pay

## 2017-02-25 DIAGNOSIS — Q438 Other specified congenital malformations of intestine: Secondary | ICD-10-CM | POA: Insufficient documentation

## 2017-02-25 DIAGNOSIS — Z96642 Presence of left artificial hip joint: Secondary | ICD-10-CM | POA: Insufficient documentation

## 2017-02-25 DIAGNOSIS — D122 Benign neoplasm of ascending colon: Secondary | ICD-10-CM

## 2017-02-25 DIAGNOSIS — Z79899 Other long term (current) drug therapy: Secondary | ICD-10-CM | POA: Diagnosis not present

## 2017-02-25 DIAGNOSIS — K644 Residual hemorrhoidal skin tags: Secondary | ICD-10-CM | POA: Diagnosis not present

## 2017-02-25 DIAGNOSIS — Z96641 Presence of right artificial hip joint: Secondary | ICD-10-CM | POA: Diagnosis not present

## 2017-02-25 DIAGNOSIS — Z8249 Family history of ischemic heart disease and other diseases of the circulatory system: Secondary | ICD-10-CM | POA: Insufficient documentation

## 2017-02-25 DIAGNOSIS — Z7982 Long term (current) use of aspirin: Secondary | ICD-10-CM | POA: Diagnosis not present

## 2017-02-25 DIAGNOSIS — Z1211 Encounter for screening for malignant neoplasm of colon: Secondary | ICD-10-CM | POA: Insufficient documentation

## 2017-02-25 DIAGNOSIS — I10 Essential (primary) hypertension: Secondary | ICD-10-CM | POA: Insufficient documentation

## 2017-02-25 DIAGNOSIS — Z8719 Personal history of other diseases of the digestive system: Secondary | ICD-10-CM | POA: Diagnosis not present

## 2017-02-25 DIAGNOSIS — K219 Gastro-esophageal reflux disease without esophagitis: Secondary | ICD-10-CM | POA: Insufficient documentation

## 2017-02-25 DIAGNOSIS — E785 Hyperlipidemia, unspecified: Secondary | ICD-10-CM | POA: Diagnosis not present

## 2017-02-25 DIAGNOSIS — Z823 Family history of stroke: Secondary | ICD-10-CM | POA: Diagnosis not present

## 2017-02-25 DIAGNOSIS — D125 Benign neoplasm of sigmoid colon: Secondary | ICD-10-CM | POA: Diagnosis not present

## 2017-02-25 DIAGNOSIS — Z833 Family history of diabetes mellitus: Secondary | ICD-10-CM | POA: Insufficient documentation

## 2017-02-25 DIAGNOSIS — D123 Benign neoplasm of transverse colon: Secondary | ICD-10-CM

## 2017-02-25 DIAGNOSIS — K635 Polyp of colon: Secondary | ICD-10-CM | POA: Insufficient documentation

## 2017-02-25 DIAGNOSIS — Z888 Allergy status to other drugs, medicaments and biological substances status: Secondary | ICD-10-CM | POA: Insufficient documentation

## 2017-02-25 DIAGNOSIS — Z86010 Personal history of colonic polyps: Secondary | ICD-10-CM | POA: Insufficient documentation

## 2017-02-25 HISTORY — PX: COLONOSCOPY: SHX5424

## 2017-02-25 HISTORY — PX: POLYPECTOMY: SHX5525

## 2017-02-25 SURGERY — COLONOSCOPY
Anesthesia: Moderate Sedation

## 2017-02-25 MED ORDER — MIDAZOLAM HCL 5 MG/5ML IJ SOLN
INTRAMUSCULAR | Status: DC | PRN
  Administered 2017-02-25 (×3): 2 mg via INTRAVENOUS

## 2017-02-25 MED ORDER — SODIUM CHLORIDE 0.9 % IV SOLN
INTRAVENOUS | Status: DC
  Administered 2017-02-25: 09:00:00 via INTRAVENOUS

## 2017-02-25 MED ORDER — MEPERIDINE HCL 100 MG/ML IJ SOLN
INTRAMUSCULAR | Status: DC | PRN
  Administered 2017-02-25 (×2): 25 mg via INTRAVENOUS
  Administered 2017-02-25: 50 mg via INTRAVENOUS

## 2017-02-25 MED ORDER — PROMETHAZINE HCL 25 MG/ML IJ SOLN: 12.5000 mg | Freq: Once | INTRAMUSCULAR | Status: DC

## 2017-02-25 MED ORDER — MEPERIDINE HCL 100 MG/ML IJ SOLN
INTRAMUSCULAR | Status: AC
  Filled 2017-02-25: qty 2

## 2017-02-25 MED ORDER — PROMETHAZINE HCL 25 MG/ML IJ SOLN
INTRAMUSCULAR | Status: AC
  Administered 2017-02-25: 12.5 mg
  Filled 2017-02-25: qty 1

## 2017-02-25 MED ORDER — ONDANSETRON HCL 4 MG/2ML IJ SOLN
INTRAMUSCULAR | Status: AC
  Filled 2017-02-25: qty 2

## 2017-02-25 MED ORDER — SODIUM CHLORIDE 0.9% FLUSH
INTRAVENOUS | Status: AC
  Administered 2017-02-25: 10 mL
  Filled 2017-02-25: qty 10

## 2017-02-25 MED ORDER — ONDANSETRON HCL 4 MG/2ML IJ SOLN
INTRAMUSCULAR | Status: DC | PRN
  Administered 2017-02-25: 4 mg via INTRAVENOUS

## 2017-02-25 MED ORDER — MIDAZOLAM HCL 5 MG/5ML IJ SOLN
INTRAMUSCULAR | Status: DC
Start: 2017-02-25 — End: 2017-02-25
  Filled 2017-02-25: qty 10

## 2017-02-25 NOTE — Discharge Instructions (Signed)
You have small EXTERNAL HEMORRHOIDS and diverticulosis IN YOUR LEFT  COLON. YOU HAD SIX POLYPS REMOVED.   DRINK WATER TO KEEP YOUR URINE LIGHT YELLOW.  FOLLOW A HIGH FIBER DIET. AVOID ITEMS THAT CAUSE BLOATING. See info below. CONTINUE YOUR WEIGHT LOSS EFFORTS.  WHILE I DO NOT WANT TO ALARM YOU, YOUR BODY MASS INDEX (BMI) IS OVER 30 WHICH MEANS YOU ARE OBESE. OBESITY ACTIVATES CANCER GENES.  OBESITY IS ASSOCIATED WITH CIRRHOSIS AND AN INCREASED RISK FOR ALL CANCERS, INCLUDING ESOPHAGEAL AND COLON CANCER. A WEIGHT OF 185-190 LBS WILL GET YOUR BMI UNDER 30.  YOUR BIOPSY RESULTS WILL BE AVAILABLE IN MY CHART AFTER OCT 1  AND MY OFFICE WILL CONTACT YOU IN 10-14 DAYS WITH YOUR RESULTS.   Next colonoscopy in 3 years.  Colonoscopy Care After Read the instructions outlined below and refer to this sheet in the next week. These discharge instructions provide you with general information on caring for yourself after you leave the hospital. While your treatment has been planned according to the most current medical practices available, unavoidable complications occasionally occur. If you have any problems or questions after discharge, call DR. Navi Ewton, [phone].  ACTIVITY  You may resume your regular activity, but move at a slower pace for the next 24 hours.   Take frequent rest periods for the next 24 hours.   Walking will help get rid of the air and reduce the bloated feeling in your belly (abdomen).   No driving for 24 hours (because of the medicine (anesthesia) used during the test).   You may shower.   Do not sign any important legal documents or operate any machinery for 24 hours (because of the anesthesia used during the test).    NUTRITION  Drink plenty of fluids.   You may resume your normal diet as instructed by your doctor.   Begin with a light meal and progress to your normal diet. Heavy or fried foods are harder to digest and may make you feel sick to your stomach (nauseated).    Avoid alcoholic beverages for 24 hours or as instructed.    MEDICATIONS  You may resume your normal medications.   WHAT YOU CAN EXPECT TODAY  Some feelings of bloating in the abdomen.   Passage of more gas than usual.   Spotting of blood in your stool or on the toilet paper  .  IF YOU HAD POLYPS REMOVED DURING THE COLONOSCOPY:  Eat a soft diet IF YOU HAVE NAUSEA, BLOATING, ABDOMINAL PAIN, OR VOMITING.    FINDING OUT THE RESULTS OF YOUR TEST Not all test results are available during your visit. DR. Oneida Alar WILL CALL YOU WITHIN 14 DAYS OF YOUR PROCEDUE WITH YOUR RESULTS. Do not assume everything is normal if you have not heard from DR. Jaysion Ramseyer, CALL HER OFFICE AT [phone].  SEEK IMMEDIATE MEDICAL ATTENTION AND CALL THE OFFICE: [phone] IF:  You have more than a spotting of blood in your stool.   Your belly is swollen (abdominal distention).   You are nauseated or vomiting.   You have a temperature over 101F.   You have abdominal pain or discomfort that is severe or gets worse throughout the day.  High-Fiber Diet A high-fiber diet changes your normal diet to include more whole grains, legumes, fruits, and vegetables. Changes in the diet involve replacing refined carbohydrates with unrefined foods. The calorie level of the diet is essentially unchanged. The Dietary Reference Intake (recommended amount) for adult males is 38 grams per day.  For adult females, it is 25 grams per day. Pregnant and lactating women should consume 28 grams of fiber per day. Fiber is the intact part of a plant that is not broken down during digestion. Functional fiber is fiber that has been isolated from the plant to provide a beneficial effect in the body. PURPOSE  Increase stool bulk.   Ease and regulate bowel movements.   Lower cholesterol.   REDUCE RISK OF COLON CANCER  INDICATIONS THAT YOU NEED MORE FIBER  Constipation and hemorrhoids.   Uncomplicated diverticulosis (intestine  condition) and irritable bowel syndrome.   Weight management.   As a protective measure against hardening of the arteries (atherosclerosis), diabetes, and cancer.   GUIDELINES FOR INCREASING FIBER IN THE DIET  Start adding fiber to the diet slowly. A gradual increase of about 5 more grams (2 slices of whole-wheat bread, 2 servings of most fruits or vegetables, or 1 bowl of high-fiber cereal) per day is best. Too rapid an increase in fiber may result in constipation, flatulence, and bloating.   Drink enough water and fluids to keep your urine clear or pale yellow. Water, juice, or caffeine-free drinks are recommended. Not drinking enough fluid may cause constipation.   Eat a variety of high-fiber foods rather than one type of fiber.   Try to increase your intake of fiber through using high-fiber foods rather than fiber pills or supplements that contain small amounts of fiber.   The goal is to change the types of food eaten. Do not supplement your present diet with high-fiber foods, but replace foods in your present diet.   INCLUDE A VARIETY OF FIBER SOURCES  Replace refined and processed grains with whole grains, canned fruits with fresh fruits, and incorporate other fiber sources. White rice, white breads, and most bakery goods contain little or no fiber.   Brown whole-grain rice, buckwheat oats, and many fruits and vegetables are all good sources of fiber. These include: broccoli, Brussels sprouts, cabbage, cauliflower, beets, sweet potatoes, white potatoes (skin on), carrots, tomatoes, eggplant, squash, berries, fresh fruits, and dried fruits.   Cereals appear to be the richest source of fiber. Cereal fiber is found in whole grains and bran. Bran is the fiber-rich outer coat of cereal grain, which is largely removed in refining. In whole-grain cereals, the bran remains. In breakfast cereals, the largest amount of fiber is found in those with "bran" in their names. The fiber content is  sometimes indicated on the label.   You may need to include additional fruits and vegetables each day.   In baking, for 1 cup white flour, you may use the following substitutions:   1 cup whole-wheat flour minus 2 tablespoons.   1/2 cup white flour plus 1/2 cup whole-wheat flour.   Polyps, Colon  A polyp is extra tissue that grows inside your body. Colon polyps grow in the large intestine. The large intestine, also called the colon, is part of your digestive system. It is a long, hollow tube at the end of your digestive tract where your body makes and stores stool. Most polyps are not dangerous. They are benign. This means they are not cancerous. But over time, some types of polyps can turn into cancer. Polyps that are smaller than a pea are usually not harmful. But larger polyps could someday become or may already be cancerous. To be safe, doctors remove all polyps and test them.   WHO GETS POLYPS? Anyone can get polyps, but certain people are more likely  than others. You may have a greater chance of getting polyps if:  You are over 50.   You have had polyps before.   Someone in your family has had polyps.   Someone in your family has had cancer of the large intestine.   Find out if someone in your family has had polyps. You may also be more likely to get polyps if you:   Eat a lot of fatty foods   Smoke   Drink alcohol   Do not exercise  Eat too much     PREVENTION There is not one sure way to prevent polyps. You might be able to lower your risk of getting them if you:  Eat more fruits and vegetables and less fatty food.   Do not smoke.   Avoid alcohol.   Exercise every day.   Lose weight if you are overweight.   Eating more calcium and folate can also lower your risk of getting polyps. Some foods that are rich in calcium are milk, cheese, and broccoli. Some foods that are rich in folate are chickpeas, kidney beans, and spinach.    Diverticulosis Diverticulosis  is a common condition that develops when small pouches (diverticula) form in the wall of the colon. The risk of diverticulosis increases with age. It happens more often in people who eat a low-fiber diet. Most individuals with diverticulosis have no symptoms. Those individuals with symptoms usually experience belly (abdominal) pain, constipation, or loose stools (diarrhea).  HOME CARE INSTRUCTIONS  Increase the amount of fiber in your diet as directed by your caregiver or dietician. This may reduce symptoms of diverticulosis.   Drink at least 6 to 8 glasses of water each day to prevent constipation.   Try not to strain when you have a bowel movement.   Avoiding nuts and seeds to prevent complications is NOT NECESSARY.   FOODS HAVING HIGH FIBER CONTENT INCLUDE:  Fruits. Apple, peach, pear, tangerine, raisins, prunes.   Vegetables. Brussels sprouts, asparagus, broccoli, cabbage, carrot, cauliflower, romaine lettuce, spinach, summer squash, tomato, winter squash, zucchini.   Starchy Vegetables. Baked beans, kidney beans, lima beans, split peas, lentils, potatoes (with skin).   Grains. Whole wheat bread, brown rice, bran flake cereal, plain oatmeal, white rice, shredded wheat, bran muffins.    SEEK IMMEDIATE MEDICAL CARE IF:  You develop increasing pain or severe bloating.   You have an oral temperature above 101F.   You develop vomiting or bowel movements that are bloody or black.   Hemorrhoids Hemorrhoids are dilated (enlarged) veins around the rectum. Sometimes clots will form in the veins. This makes them swollen and painful. These are called thrombosed hemorrhoids. Causes of hemorrhoids include:  Constipation.   Straining to have a bowel movement.   HEAVY LIFTING  HOME CARE INSTRUCTIONS  Eat a well balanced diet and drink 6 to 8 glasses of water every day to avoid constipation. You may also use a bulk laxative.   Avoid straining to have bowel movements.   Keep anal  area dry and clean.   Do not use a donut shaped pillow or sit on the toilet for long periods. This increases blood pooling and pain.   Move your bowels when your body has the urge; this will require less straining and will decrease pain and pressure.

## 2017-02-25 NOTE — H&P (Signed)
Primary Care Physician:  Vesta Mixer Primary Gastroenterologist:  Dr. Oneida Alar  Pre-Procedure History & Physical: HPI:  Ruth Hughes is a 61 y.o. female here for  PERSONAL HISTORY OF POLYPS.  Past Medical History:  Diagnosis Date  . Cancer ([place])   . Degenerative joint disease    s/p right THA; left THA planned for 03/2012  . GERD (gastroesophageal reflux disease)    Hiatal hernia  . Goiter   . Hx of adenomatous colonic polyps   . Hyperlipidemia    H/o PVCs  . Hypertension   . Spinal stenosis    CT in 07/2010: L3-4; degenerative joint disease at L4-5    Past Surgical History:  Procedure Laterality Date  . COLONOSCOPY  01/07/2001   hyperplastic polyp/tubulovillous adenoma with severe atypia  . COLONOSCOPY     + ileoscopy: per patient has additional one in 2006, 2007 and Flex sig in 2008. Told next colonoscopy in 2013.   Marland Kitchen COLONOSCOPY  09/03/11   internal hemrrhoids/diverticulosis in the left colon/ TUBULAR ADENOMA of descending colon. random colon bx negative for microscopic colitis. next TCS 08/2016  . COLONOSCOPY W/ POLYPECTOMY  12/14/2003   Dr. Patel-->1.5 cm size polyp with patchy whitish mucosa in sigmoid colon around base of polyp. Path-adenomatous polyp  . cubital tunnel repair    . ESOPHAGOGASTRODUODENOSCOPY  2005   hiatal hernia, GERD per patient  . ESOPHAGOGASTRODUODENOSCOPY  09/2000   Dr. Betti Cruz, minimal gastritis, bx no hpylori  . ESOPHAGOGASTRODUODENOSCOPY  09/03/11   small bowel bx negative for celiac, mild chronic inactive gastritis, no h.pylori.   . ESOPHAGOGASTRODUODENOSCOPY N/A 08/08/2013   Dr. Oneida Alar: gastritis, small hiatal hernia, multiple small sessile polyps  . KNEE SURGERY  03/04   right  . TOTAL HIP ARTHROPLASTY  2001   Right  . TOTAL HIP ARTHROPLASTY  2013   right  . TOTAL HIP ARTHROPLASTY  08/2012   left    Prior to Admission medications   Medication Sig Start Date End Date Taking? Authorizing Provider  aspirin 81 MG tablet Take 81 mg  by mouth daily.   Yes [provider]  cetirizine (ZYRTEC) 10 MG tablet Take 10 mg by mouth daily.   Yes [provider]  Cholecalciferol (VITAMIN D3) 2000 UNITS TABS Take 2,000 Units by mouth daily.   Yes [provider]  metoprolol tartrate (LOPRESSOR) 25 MG tablet Take 12.5 mg by mouth 2 (two) times daily.   Yes [provider]  Na Sulfate-K Sulfate-Mg Sulf (SUPREP BOWEL PREP KIT) 17.5-3.13-1.6 GM/180ML SOLN Take 1 kit by mouth as directed. 01/06/17  Yes Moss Berry, Marga Melnick, MD  Omega-3 Fatty Acids (OMEGA-3 FISH OIL PO) Take 1,000 mg by mouth daily.   Yes [provider]  pantoprazole (PROTONIX) 40 MG tablet 1 PO 30 MINUTES PRIOR TO MEALS BID FOR 1 MOS THEN QD 09/11/16  Yes Asahd Can L, MD  Probiotic Product (ADVANCED PROBIOTIC PO) Take 1 capsule by mouth daily.   Yes [provider]  sertraline (ZOLOFT) 50 MG tablet Take 50 mg by mouth at bedtime.    Yes [provider]  simvastatin (ZOCOR) 20 MG tablet Take 20 mg by mouth daily at 6 PM.  07/05/14  Yes [provider]  triamcinolone cream (KENALOG) 0.1 % Apply 1 application topically 2 (two) times daily as needed (for rash).   Yes [provider]    Allergies as of 01/06/2017 - Review Complete 01/06/2017  Allergen Reaction Noted  . Bextra [valdecoxib] Nausea  Only 08/24/2011  . Tramadol Nausea Only 08/24/2011    Family History  Problem Relation Age of Onset  . Hypertension Mother   . Hyperlipidemia Mother   . Stroke Mother        CAD  . Diabetes Mother   . Hypertension Father        CAD  . Diabetes Father   . Diabetes Sister   . Hypertension Brother   . Hyperlipidemia Brother   . Colon cancer Neg Hx   . Breast cancer Neg Hx     Social History   Social History  . Marital status: Single    Spouse name: N/A  . Number of children: 1  . Years of education: N/A   Occupational History  . 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 cna     retired   . daycare     retired   Social History Main Topics  . Smoking status: Never Smoker  . Smokeless tobacco: Never Used     Comment: Never smoked  . Alcohol use No  . Drug use: No  . Sexual activity: Not on file   Other Topics Concern  . Not on file   Social History Narrative  . No narrative on file    Review of Systems: See HPI, otherwise negative ROS   Physical Exam: BP (!) 100/59   Pulse 61   Temp 97.7 F (36.5 C) (Oral)   Resp 12   Ht 5' 7"  (1.702 m)   Wt 219 lb (99.3 kg)   SpO2 98%   BMI 34.30 kg/m  General:   Alert,  pleasant and cooperative in NAD Head:  Normocephalic and atraumatic. Neck:  Supple; Lungs:  Clear throughout to auscultation.    Heart:  Regular rate and rhythm. Abdomen:  Soft, nontender and nondistended. Normal bowel sounds, without guarding, and without rebound.   Neurologic:  Alert and  oriented x4;  grossly normal neurologically.  Impression/Plan:     PERSONAL HISTORY OF POLYPS.  PLAN: 1. TCS TODAY DISCUSSED PROCEDURE, BENEFITS, & RISKS: < 1% chance of medication reaction, bleeding, perforation, or rupture of spleen/liver.

## 2017-02-25 NOTE — Op Note (Signed)
Ruth Hughes birth date Jul 07, 1955 was seen at [facility] on 02/25/2017 by Dr. Barney Drain. Patient cannot drive for 24 hours after procedure.

## 2017-02-25 NOTE — Op Note (Signed)
[facility] Patient Name: Ruth Hughes Procedure Date: 02/25/2017 9:02 AM MRN: [id number] Date of Birth: 1955-11-20 Attending MD: Barney Drain MD, MD CSN: [id number] Age: 61 Admit Type: Outpatient Procedure:                Colonoscopy with COLD FORCEPS/SNARE POLYPECTOMY Indications:              High risk colon cancer surveillance: Personal                            history of colonic polyps Providers:                Barney Drain MD, MD, Lurline Del, RN, Aram Candela Referring MD:              Medicines:                Promethazine 12.5 mg IV, Ondansetron 4 mg IV,                            Meperidine 100 mg IV, Midazolam 6 mg IV Complications:            No immediate complications. Estimated Blood Loss:     Estimated blood loss was minimal. Procedure:                Pre-Anesthesia Assessment:                           - Prior to the procedure, a History and Physical                            was performed, and patient medications and                            allergies were reviewed. The patient's tolerance of                            previous anesthesia was also reviewed. The risks                            and benefits of the procedure and the sedation                            options and risks were discussed with the patient.                            All questions were answered, and informed consent                            was obtained. Prior Anticoagulants: The patient has                            taken aspirin, last dose was 1 day prior to                            procedure. ASA Grade Assessment: II - A patient  with mild systemic disease. After reviewing the                            risks and benefits, the patient was deemed in                            satisfactory condition to undergo the procedure.                            After obtaining informed consent, the colonoscope                            was passed under direct  vision. Throughout the                            procedure, the patient's blood pressure, pulse, and                            oxygen saturations were monitored continuously. The                            EC-[id number] ([id number]) scope was introduced through                            the anus and advanced to the the cecum, identified                            by appendiceal orifice and ileocecal valve. After                            obtaining informed consent, the colonoscope was                            passed under direct vision. Throughout the                            procedure, the patient's blood pressure, pulse, and                            oxygen saturations were monitored continuously.The                            colonoscopy was somewhat difficult due to a                            tortuous colon and the patient's oxygen                            desaturation. Successful completion of the                            procedure was aided by administering oxygen and  COLOWRAP. The patient tolerated the procedure                            fairly well. The quality of the bowel preparation                            was excellent. The ileocecal valve, appendiceal                            orifice, and rectum were photographed. Scope In: 9:30:51 AM Scope Out: 9:52:36 AM Scope Withdrawal Time: 0 hours 18 minutes 2 seconds  Total Procedure Duration: 0 hours 21 minutes 45 seconds  Findings:      Three sessile polyps were found in the splenic flexure, hepatic flexure       and ascending colon. The polyps were 2 to 3 mm in size. These polyps       were removed with a cold biopsy forceps. Resection and retrieval were       complete.      Three sessile polyps were found in the distal sigmoid colon and       ascending colon. The polyps were 4 to 5 mm in size. These polyps were       removed with a cold snare. Resection and retrieval were complete.      A  few small-mouthed diverticula were found in the recto-sigmoid colon       and sigmoid colon.      The recto-sigmoid colon and sigmoid colon were mildly redundant.      External hemorrhoids were found during retroflexion. The hemorrhoids       were small. Impression:               - Three 2 to 3 mm polyps at the splenic flexure, at                            the hepatic flexure(BTL 1) and in the ascending                            colon(1 BTL 1), removed with a cold biopsy forceps.                            Resected and retrieved.                           - Three 4 to 5 mm polyps in the distal sigmoid                            colon and in the ascending colon(2 BTL 1)), removed                            with a cold snare. Resected and retrieved.                           - Diverticulosis in the recto-sigmoid colon and in  the sigmoid colon.                           - Redundant LEFT colon.                           - External hemorrhoids. Moderate Sedation:      Moderate (conscious) sedation was administered by the endoscopy nurse       and supervised by the endoscopist. The following parameters were       monitored: oxygen saturation, heart rate, blood pressure, and response       to care. Recommendation:           - Repeat colonoscopy in 3 years for surveillance.                           - High fiber diet.                           - Continue present medications.                           - Await pathology results.                           - Patient has a contact number available for                            emergencies. The signs and symptoms of potential                            delayed complications were discussed with the                            patient. Return to normal activities tomorrow.                            Written discharge instructions were provided to the                            patient. Procedure Code(s):        ---  Professional ---                           [phone], Colonoscopy, flexible; with removal of                            tumor(s), polyp(s), or other lesion(s) by snare                            technique                           45380, 59, Colonoscopy, flexible; with biopsy,                            single or multiple Diagnosis Code(s):        ---  Professional ---                           Z86.010, Personal history of colonic polyps                           D12.3, Benign neoplasm of transverse colon (hepatic                            flexure or splenic flexure)                           D12.5, Benign neoplasm of sigmoid colon                           D12.2, Benign neoplasm of ascending colon                           K64.4, Residual hemorrhoidal skin tags                           K57.30, Diverticulosis of large intestine without                            perforation or abscess without bleeding                           Q43.8, Other specified congenital malformations of                            intestine CPT copyright 2016 American Medical Association. All rights reserved. The codes documented in this report are preliminary and upon coder review may  be revised to meet current compliance requirements. Barney Drain, MD Barney Drain MD, MD 02/25/2017 10:09:02 AM This report has been signed electronically. Number of Addenda: 0

## 2017-03-04 NOTE — Progress Notes (Signed)
Pt is aware.  

## 2017-03-05 ENCOUNTER — Encounter (HOSPITAL_COMMUNITY): Payer: Self-pay | Admitting: Gastroenterology

## 2017-07-12 ENCOUNTER — Encounter: Payer: Self-pay | Admitting: *Deleted

## 2017-07-12 ENCOUNTER — Encounter: Payer: Self-pay | Admitting: Gastroenterology

## 2017-07-12 ENCOUNTER — Ambulatory Visit (INDEPENDENT_AMBULATORY_CARE_PROVIDER_SITE_OTHER): Payer: Medicare Other | Admitting: Gastroenterology

## 2017-07-12 DIAGNOSIS — R1011 Right upper quadrant pain: Secondary | ICD-10-CM | POA: Diagnosis not present

## 2017-07-12 NOTE — Progress Notes (Signed)
Primary Care Physician:  Zhou-Talbert, Elwyn Lade, MD Primary GI: Dr. Oneida Alar   Chief Complaint  Patient presents with  . Abdominal Pain    right side, constant pain, going on for a while    HPI:   Ruth Hughes is a 62 y.o. female presenting today with a history of GERD, gastritis, adenomas. Recently completed colonoscopy in Sept 2018 with multiple adenomas, and she will be due for surveillance in 2021. History of chronic abdominal pain. Prior evaluation including CT April 2018 that was without acute findings. Last EGD in 2015 with gastritis. US abdomen April 2018: no gallstones.  HIDA scan Sept 2015: normal EF at 80%, with discomfort noted with CCK infusion.   RUQ pain, never gone away. Noted as constant. Notes early satiety. No nausea. H.pylori breath stool test negative through PCP. Was on PPI when this was completed. Eating more fiber. Walking daily, 30-40 minutes. Stress makes abdominal pain worse. After eating, pain is not as bad as she is eating only small amounts. Afraid to eat fish. Sometimes pain will throb. Feels like bee stings at times.      Past Medical History:  Diagnosis Date  . Cancer ([place])   . Degenerative joint disease    s/p right THA; left THA planned for 03/2012  . GERD (gastroesophageal reflux disease)    Hiatal hernia  . Goiter   . Hx of adenomatous colonic polyps   . Hyperlipidemia    H/o PVCs  . Hypertension   . Spinal stenosis    CT in 07/2010: L3-4; degenerative joint disease at L4-5    Past Surgical History:  Procedure Laterality Date  . COLONOSCOPY  01/07/2001   hyperplastic polyp/tubulovillous adenoma with severe atypia  . COLONOSCOPY     + ileoscopy: per patient has additional one in 2006, 2007 and Flex sig in 2008. Told next colonoscopy in 2013.   Marland Kitchen COLONOSCOPY  09/03/11   internal hemrrhoids/diverticulosis in the left colon/ TUBULAR ADENOMA of descending colon. random colon bx negative for microscopic colitis. next TCS 08/2016  .  COLONOSCOPY N/A 02/25/2017   Dr. Oneida Alar: Three 2-3 mm polyps at splenic flexure, hepatic flexure, and ascending colon, three sessile polyps in distal sigmoid and ascending colon (4-5 mm), few small-mouthed diverticula in rect-sigmoid and sigmoid, external hemorrhoids. 3 year surveillance. (simple adenomas and benign polypoid lesions)  . COLONOSCOPY W/ POLYPECTOMY  12/14/2003   Dr. Patel-->1.5 cm size polyp with patchy whitish mucosa in sigmoid colon around base of polyp. Path-adenomatous polyp  . cubital tunnel repair    . ESOPHAGOGASTRODUODENOSCOPY  2005   hiatal hernia, GERD per patient  . ESOPHAGOGASTRODUODENOSCOPY  09/2000   Dr. Betti Cruz, minimal gastritis, bx no hpylori  . ESOPHAGOGASTRODUODENOSCOPY  09/03/11   small bowel bx negative for celiac, mild chronic inactive gastritis, no h.pylori.   . ESOPHAGOGASTRODUODENOSCOPY N/A 08/08/2013   Dr. Oneida Alar: gastritis, small hiatal hernia, multiple small sessile polyps  . KNEE SURGERY  03/04   right  . POLYPECTOMY  02/25/2017   Procedure: POLYPECTOMY;  Surgeon: Danie Binder, MD;  Location: [facility];  Service: Endoscopy;;  ascending colonx3; hepatic flexure; splenic flexure  . TOTAL HIP ARTHROPLASTY  2001   Right  . TOTAL HIP ARTHROPLASTY  2013   right  . TOTAL HIP ARTHROPLASTY  08/2012   left    Current Outpatient Medications  Medication Sig Dispense Refill  . acetaminophen (TYLENOL) 500 MG tablet Take 500 mg by mouth every 6 (six) hours as  needed.    Marland Kitchen aspirin 81 MG tablet Take 81 mg by mouth daily.    . cetirizine (ZYRTEC) 10 MG tablet Take 10 mg by mouth daily.    . Cholecalciferol (VITAMIN D3) 2000 UNITS TABS Take 2,000 Units by mouth daily.    . metoprolol tartrate (LOPRESSOR) 25 MG tablet Take 12.5 mg by mouth 2 (two) times daily.    . Omega-3 Fatty Acids (OMEGA-3 FISH OIL PO) Take 1,000 mg by mouth daily.    . pantoprazole (PROTONIX) 40 MG tablet 1 PO 30 MINUTES PRIOR TO MEALS BID FOR 1 MOS THEN QD 60 tablet 11  . Probiotic  Product (ADVANCED PROBIOTIC PO) Take 1 capsule by mouth daily.    . sertraline (ZOLOFT) 50 MG tablet Take 50 mg by mouth at bedtime.     . simvastatin (ZOCOR) 20 MG tablet Take 20 mg by mouth daily at 6 PM.     . triamcinolone cream (KENALOG) 0.1 % Apply 1 application topically 2 (two) times daily as needed (for rash).     No current facility-administered medications for this visit.     Allergies as of 07/12/2017 - Review Complete 07/12/2017  Allergen Reaction Noted  . Bextra [valdecoxib] Nausea Only 08/24/2011  . Tramadol Nausea Only 08/24/2011    Family History  Problem Relation Age of Onset  . Hypertension Mother   . Hyperlipidemia Mother   . Stroke Mother        CAD  . Diabetes Mother   . Hypertension Father        CAD  . Diabetes Father   . Diabetes Sister   . Hypertension Brother   . Hyperlipidemia Brother   . Colon cancer Neg Hx   . Breast cancer Neg Hx     Social History   Socioeconomic History  . Marital status: Single    Spouse name: None  . Number of children: 1  . Years of education: None  . Highest education level: None  Social Needs  . Financial resource strain: None  . Food insecurity - worry: None  . Food insecurity - inability: None  . Transportation needs - medical: None  . Transportation needs - non-medical: None  Occupational History  . 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 Occupation: cna    Comment: retired  . Occupation: daycare    Comment: retired  Tobacco Use  . Smoking status: Never Smoker  . Smokeless tobacco: Never Used  . Tobacco comment: Never smoked  Substance and Sexual Activity  . Alcohol use: No    Alcohol/week: 0.0 oz  . Drug use: No  . Sexual activity: None  Other Topics Concern  . None  Social History Narrative  . None    Review of Systems: Gen: Denies fever, chills, anorexia. Denies fatigue, weakness, weight loss.  CV: Denies chest pain, palpitations, syncope, peripheral edema, and  claudication. Resp: Denies dyspnea at rest, cough, wheezing, coughing up blood, and pleurisy. GI: see HPI  Derm: Denies rash, itching, dry skin Psych: Denies depression, anxiety, memory loss, confusion. No homicidal or suicidal ideation.  Heme: Denies bruising, bleeding, and enlarged lymph nodes.  Physical Exam: BP 134/74   Pulse 60   Temp 98 F (36.7 C) (Oral)   Ht 5\' 7"  (1.702 m)   Wt 217 lb 6.4 oz (98.6 kg)   BMI 34.05 kg/m  General:   Alert and oriented. No distress noted. Pleasant and cooperative.  Head:  Normocephalic and atraumatic.  Eyes:  Conjuctiva clear without scleral icterus. Mouth:  Oral mucosa pink and moist. Good dentition. No lesions. Abdomen:  +BS, soft, TTP epigastric and RUQ and non-distended. No rebound or guarding. No HSM or masses noted. Msk:  Symmetrical without gross deformities. Normal posture. Extremities:  Without edema. Neurologic:  Alert and  oriented x4 Psych:  Alert and cooperative. Normal mood and affect.

## 2017-07-12 NOTE — Patient Instructions (Signed)
I will get the copy of the blood work you recently had done.  We have ordered an updated HIDA scan, as the last one you had was in 2015.   Continue Protonix once daily.  We will see you in 3 months!  Please let Dr. Angelia Mould know she can call if any questions or needs to discuss your plan of care.   It was good to see you again!  It was a pleasure to see you today. I strive to create trusting relationships with patients to provide genuine, compassionate, and quality care. I value your feedback. If you receive a survey regarding your visit,  I greatly appreciate you the taking time to fill this out.   Annitta Needs, PhD, ANP-BC [facility]

## 2017-07-12 NOTE — Assessment & Plan Note (Signed)
62 year old very pleasant female with history of chronic abdominal pain, s/p evaluation as noted in HPI. Gallbladder remains, but I doubt this is the culprit as she does not have typical biliary symptoms and her pain is present even without oral intake. Also of note, stress worsens RUQ pain. On exam, she is tender to epigastric and RUQ abdominal area but no rebound, guarding, or concerning signs. Will request most recent blood work from PCP. I have also ordered a HIDA scan to be updated, as last was 4 years ago. She did note reproduction of symptoms with CCK infusion in the past. I doubt an EGD would shed more light, and a CT scan is fairly recently on file. No significant weight loss. I note H.pylori stool antigen was negative, but this was done while taking a PPI per patient's report. Unable to exclude false negative. If high suspicion, would repeat after off PPI for 2 weeks. Could consider EGD if persistent symptoms, weight loss, dysphagia, melena. Return in 3 months and await HIDA findings.

## 2017-07-12 NOTE — Progress Notes (Signed)
CC'D TO PCP °

## 2017-07-19 ENCOUNTER — Encounter (HOSPITAL_COMMUNITY)
Admission: RE | Admit: 2017-07-19 | Discharge: 2017-07-19 | Disposition: A | Discharge status: Home / Self Care | Payer: Medicare Other | Source: Ambulatory Visit | Attending: Gastroenterology | Admitting: Gastroenterology

## 2017-07-19 ENCOUNTER — Encounter (HOSPITAL_COMMUNITY): Payer: Self-pay

## 2017-07-19 DIAGNOSIS — R1011 Right upper quadrant pain: Secondary | ICD-10-CM | POA: Diagnosis not present

## 2017-07-19 MED ORDER — TECHNETIUM TC 99M MEBROFENIN IV KIT
5.0000 | PACK | Freq: Once | INTRAVENOUS | Status: AC | PRN
  Administered 2017-07-19: 5.2 via INTRAVENOUS

## 2017-07-19 MED ORDER — SODIUM CHLORIDE 0.9% FLUSH
INTRAVENOUS | Status: AC
  Filled 2017-07-19: qty 160

## 2017-07-22 ENCOUNTER — Encounter: Payer: Self-pay | Admitting: Gastroenterology

## 2017-07-22 ENCOUNTER — Other Ambulatory Visit: Payer: Self-pay | Admitting: Gastroenterology

## 2017-07-22 ENCOUNTER — Telehealth: Payer: Self-pay | Admitting: Gastroenterology

## 2017-07-22 NOTE — Telephone Encounter (Signed)
Pt is aware that Roseanne Kaufman, NP is not in the office this afternoon. I will call pt when results are ready.

## 2017-07-22 NOTE — Telephone Encounter (Signed)
PLEASE CALL PATIENT IF HER HIDA SCAN RESULTS ARE IN

## 2017-07-23 NOTE — Progress Notes (Signed)
PT is aware. She said that she had pain and was shaking during the test and she kept asking the technician how much longer.  She just wonders what could be causing her problem.  Pt is aware we are waiting on recent lab work from PCP.  Forwarding to Manuela Schwartz to try to get again.

## 2017-07-23 NOTE — Progress Notes (Signed)
She has a low EF on HIDA. Appears she also had discomfort during test. Her symptoms were not entirely convincing for definite biliary etiology when I saw her in clinic, but this test is showing she does have a low EF. I still need labs from PCP

## 2017-07-23 NOTE — Telephone Encounter (Signed)
Please see result note 

## 2017-07-26 NOTE — Progress Notes (Signed)
cc'd to pcp 

## 2017-07-27 ENCOUNTER — Other Ambulatory Visit: Payer: Self-pay | Admitting: *Deleted

## 2017-07-27 DIAGNOSIS — R109 Unspecified abdominal pain: Secondary | ICD-10-CM

## 2017-07-27 NOTE — Progress Notes (Signed)
PT is aware and OK to refer.

## 2017-07-27 NOTE — Progress Notes (Signed)
I would refer her to Surgery, Dr. Arnoldo Morale or Dr. Constance Haw. She was symptomatic with HIDA. I sent a Mychart message saying this: I recommend referring you to the surgeon just to discuss the risks and benefits of removing your gallbladder. This test showed that when your gallbladder was stimulated, it was sluggish and you had pain with the exam. Sometimes, people get their gallbladder out and still have intermittent pain due to chronic pain issues. However, this is something you can discuss with them. Would you like [place] to refer you?

## 2017-08-05 ENCOUNTER — Ambulatory Visit: Payer: Medicare Other | Admitting: General Surgery

## 2017-08-19 ENCOUNTER — Ambulatory Visit (INDEPENDENT_AMBULATORY_CARE_PROVIDER_SITE_OTHER): Payer: Medicare Other | Admitting: General Surgery

## 2017-08-19 ENCOUNTER — Encounter: Payer: Self-pay | Admitting: General Surgery

## 2017-08-19 VITALS — BP 139/70 | HR 65 | Temp 97.5°F | Ht 67.0 in | Wt 215.0 lb

## 2017-08-19 DIAGNOSIS — K811 Chronic cholecystitis: Secondary | ICD-10-CM

## 2017-08-19 NOTE — Progress Notes (Signed)
Ruth Hughes; [id number]; 05/21/1956   HPI Patient is a 62 year old black female who was referred to my care by Roseanne Kaufman of gastroenterology for evaluation and treatment of right upper quadrant abdominal pain.  Patient states she has had this intermittent right upper quadrant abdominal pain for some time now.  She has an extensive workup.  She states the pain is sometimes worse with eating.  She denies any nausea, vomiting, jaundice.  Recent HIDA scan was performed which revealed a low gallbladder ejection fraction with reproducible symptoms with a fatty meal.  Patient currently has a pain level of 6 out of 10. Past Medical History:  Diagnosis Date  . Cancer ([place])   . Degenerative joint disease    s/p right THA; left THA planned for 03/2012  . GERD (gastroesophageal reflux disease)    Hiatal hernia  . Goiter   . Hx of adenomatous colonic polyps   . Hyperlipidemia    H/o PVCs  . Hypertension   . Spinal stenosis    CT in 07/2010: L3-4; degenerative joint disease at L4-5    Past Surgical History:  Procedure Laterality Date  . COLONOSCOPY  01/07/2001   hyperplastic polyp/tubulovillous adenoma with severe atypia  . COLONOSCOPY     + ileoscopy: per patient has additional one in 2006, 2007 and Flex sig in 2008. Told next colonoscopy in 2013.   Marland Kitchen COLONOSCOPY  09/03/11   internal hemrrhoids/diverticulosis in the left colon/ TUBULAR ADENOMA of descending colon. random colon bx negative for microscopic colitis. next TCS 08/2016  . COLONOSCOPY N/A 02/25/2017   Dr. Oneida Alar: Three 2-3 mm polyps at splenic flexure, hepatic flexure, and ascending colon, three sessile polyps in distal sigmoid and ascending colon (4-5 mm), few small-mouthed diverticula in rect-sigmoid and sigmoid, external hemorrhoids. 3 year surveillance. (simple adenomas and benign polypoid lesions)  . COLONOSCOPY W/ POLYPECTOMY  12/14/2003   Dr. Patel-->1.5 cm size polyp with patchy whitish mucosa in sigmoid colon around base of polyp.  Path-adenomatous polyp  . cubital tunnel repair    . ESOPHAGOGASTRODUODENOSCOPY  2005   hiatal hernia, GERD per patient  . ESOPHAGOGASTRODUODENOSCOPY  09/2000   Dr. Betti Cruz, minimal gastritis, bx no hpylori  . ESOPHAGOGASTRODUODENOSCOPY  09/03/11   small bowel bx negative for celiac, mild chronic inactive gastritis, no h.pylori.   . ESOPHAGOGASTRODUODENOSCOPY N/A 08/08/2013   Dr. Oneida Alar: gastritis, small hiatal hernia, multiple small sessile polyps  . KNEE SURGERY  03/04   right  . POLYPECTOMY  02/25/2017   Procedure: POLYPECTOMY;  Surgeon: Danie Binder, MD;  Location: [facility];  Service: Endoscopy;;  ascending colonx3; hepatic flexure; splenic flexure  . TOTAL HIP ARTHROPLASTY  2001   Right  . TOTAL HIP ARTHROPLASTY  2013   right  . TOTAL HIP ARTHROPLASTY  08/2012   left    Family History  Problem Relation Age of Onset  . Hypertension Mother   . Hyperlipidemia Mother   . Stroke Mother        CAD  . Diabetes Mother   . Hypertension Father        CAD  . Diabetes Father   . Diabetes Sister   . Hypertension Brother   . Hyperlipidemia Brother   . Colon cancer Neg Hx   . Breast cancer Neg Hx     Current Outpatient Medications on File Prior to Visit  Medication Sig Dispense Refill  . acetaminophen (TYLENOL) 500 MG tablet Take 500 mg by mouth every 6 (six) hours as needed.    Marland Kitchen  aspirin 81 MG tablet Take 81 mg by mouth daily.    . cetirizine (ZYRTEC) 10 MG tablet Take 10 mg by mouth daily.    . Cholecalciferol (VITAMIN D3) 2000 UNITS TABS Take 2,000 Units by mouth daily.    . metoprolol tartrate (LOPRESSOR) 25 MG tablet Take 12.5 mg by mouth 2 (two) times daily.    . Omega-3 Fatty Acids (OMEGA-3 FISH OIL PO) Take 1,000 mg by mouth daily.    . pantoprazole (PROTONIX) 40 MG tablet TAKE 1 TABLET BY MOUTH ONCE DAILY. 30 tablet 11  . Probiotic Product (ADVANCED PROBIOTIC PO) Take 1 capsule by mouth daily.    . sertraline (ZOLOFT) 50 MG tablet Take 50 mg by mouth at bedtime.      . simvastatin (ZOCOR) 20 MG tablet Take 20 mg by mouth daily at 6 PM.     . triamcinolone cream (KENALOG) 0.1 % Apply 1 application topically 2 (two) times daily as needed (for rash).     No current facility-administered medications on file prior to visit.     Allergies  Allergen Reactions  . Bextra [Valdecoxib] Nausea Only  . Tramadol Nausea Only    Social History   Substance and Sexual Activity  Alcohol Use No  . Alcohol/week: 0.0 oz    Social History   Tobacco Use  Smoking Status Never Smoker  Smokeless Tobacco Never Used  Tobacco Comment   Never smoked    Review of Systems  HENT: Negative.   Eyes: Negative.   Respiratory: Positive for cough.   Cardiovascular: Negative.   Gastrointestinal: Positive for abdominal pain and heartburn.  Genitourinary: Positive for frequency.  Musculoskeletal: Positive for back pain and joint pain.  Skin: Positive for rash.  Neurological: Negative.   Endo/Heme/Allergies: Negative.   Psychiatric/Behavioral: Negative.     Objective   Vitals:   08/19/17 0949  BP: 139/70  Pulse: 65  Temp: (!) 97.5 F (36.4 C)    Physical Exam  Constitutional: She is oriented to person, place, and time and well-developed, well-nourished, and in no distress.  HENT:  Head: Normocephalic and atraumatic.  Eyes: No scleral icterus.  Cardiovascular: Normal rate, regular rhythm and normal heart sounds. Exam reveals no gallop and no friction rub.  No murmur heard. Pulmonary/Chest: Effort normal and breath sounds normal. No respiratory distress. She has no wheezes. She has no rales.  Abdominal: Soft. Bowel sounds are normal. She exhibits no distension. There is tenderness. There is no rebound and no guarding.  Tender in the right upper quadrant to deep palpation.  Neurological: She is alert and oriented to person, place, and time.  Skin: Skin is warm and dry.  Vitals reviewed. GI office notes reviewed.  HIDA scan report reviewed.  Assessment   Chronic cholecystitis Plan   Patient is scheduled for laparoscopic cholecystectomy on 08/27/2017.  The risks and benefits of the procedure including bleeding, infection, hepatobiliary injury, and the possibility of an open procedure were fully explained to the patient, who gave informed consent.

## 2017-08-19 NOTE — H&P (Signed)
Ruth Hughes; [id number]; 1955-10-16   HPI Patient is a 62 year old black female who was referred to my care by Roseanne Kaufman of gastroenterology for evaluation and treatment of right upper quadrant abdominal pain.  Patient states she has had this intermittent right upper quadrant abdominal pain for some time now.  She has an extensive workup.  She states the pain is sometimes worse with eating.  She denies any nausea, vomiting, jaundice.  Recent HIDA scan was performed which revealed a low gallbladder ejection fraction with reproducible symptoms with a fatty meal.  Patient currently has a pain level of 6 out of 10. Past Medical History:  Diagnosis Date  . Cancer ([place])   . Degenerative joint disease    s/p right THA; left THA planned for 03/2012  . GERD (gastroesophageal reflux disease)    Hiatal hernia  . Goiter   . Hx of adenomatous colonic polyps   . Hyperlipidemia    H/o PVCs  . Hypertension   . Spinal stenosis    CT in 07/2010: L3-4; degenerative joint disease at L4-5    Past Surgical History:  Procedure Laterality Date  . COLONOSCOPY  01/07/2001   hyperplastic polyp/tubulovillous adenoma with severe atypia  . COLONOSCOPY     + ileoscopy: per patient has additional one in 2006, 2007 and Flex sig in 2008. Told next colonoscopy in 2013.   Marland Kitchen COLONOSCOPY  09/03/11   internal hemrrhoids/diverticulosis in the left colon/ TUBULAR ADENOMA of descending colon. random colon bx negative for microscopic colitis. next TCS 08/2016  . COLONOSCOPY N/A 02/25/2017   Dr. Oneida Alar: Three 2-3 mm polyps at splenic flexure, hepatic flexure, and ascending colon, three sessile polyps in distal sigmoid and ascending colon (4-5 mm), few small-mouthed diverticula in rect-sigmoid and sigmoid, external hemorrhoids. 3 year surveillance. (simple adenomas and benign polypoid lesions)  . COLONOSCOPY W/ POLYPECTOMY  12/14/2003   Dr. Patel-->1.5 cm size polyp with patchy whitish mucosa in sigmoid colon around base of polyp.  Path-adenomatous polyp  . cubital tunnel repair    . ESOPHAGOGASTRODUODENOSCOPY  2005   hiatal hernia, GERD per patient  . ESOPHAGOGASTRODUODENOSCOPY  09/2000   Dr. Betti Cruz, minimal gastritis, bx no hpylori  . ESOPHAGOGASTRODUODENOSCOPY  09/03/11   small bowel bx negative for celiac, mild chronic inactive gastritis, no h.pylori.   . ESOPHAGOGASTRODUODENOSCOPY N/A 08/08/2013   Dr. Oneida Alar: gastritis, small hiatal hernia, multiple small sessile polyps  . KNEE SURGERY  03/04   right  . POLYPECTOMY  02/25/2017   Procedure: POLYPECTOMY;  Surgeon: Danie Binder, MD;  Location: [facility];  Service: Endoscopy;;  ascending colonx3; hepatic flexure; splenic flexure  . TOTAL HIP ARTHROPLASTY  2001   Right  . TOTAL HIP ARTHROPLASTY  2013   right  . TOTAL HIP ARTHROPLASTY  08/2012   left    Family History  Problem Relation Age of Onset  . Hypertension Mother   . Hyperlipidemia Mother   . Stroke Mother        CAD  . Diabetes Mother   . Hypertension Father        CAD  . Diabetes Father   . Diabetes Sister   . Hypertension Brother   . Hyperlipidemia Brother   . Colon cancer Neg Hx   . Breast cancer Neg Hx     Current Outpatient Medications on File Prior to Visit  Medication Sig Dispense Refill  . acetaminophen (TYLENOL) 500 MG tablet Take 500 mg by mouth every 6 (six) hours as needed.    Marland Kitchen  aspirin 81 MG tablet Take 81 mg by mouth daily.    . cetirizine (ZYRTEC) 10 MG tablet Take 10 mg by mouth daily.    . Cholecalciferol (VITAMIN D3) 2000 UNITS TABS Take 2,000 Units by mouth daily.    . metoprolol tartrate (LOPRESSOR) 25 MG tablet Take 12.5 mg by mouth 2 (two) times daily.    . Omega-3 Fatty Acids (OMEGA-3 FISH OIL PO) Take 1,000 mg by mouth daily.    . pantoprazole (PROTONIX) 40 MG tablet TAKE 1 TABLET BY MOUTH ONCE DAILY. 30 tablet 11  . Probiotic Product (ADVANCED PROBIOTIC PO) Take 1 capsule by mouth daily.    . sertraline (ZOLOFT) 50 MG tablet Take 50 mg by mouth at bedtime.      . simvastatin (ZOCOR) 20 MG tablet Take 20 mg by mouth daily at 6 PM.     . triamcinolone cream (KENALOG) 0.1 % Apply 1 application topically 2 (two) times daily as needed (for rash).     No current facility-administered medications on file prior to visit.     Allergies  Allergen Reactions  . Bextra [Valdecoxib] Nausea Only  . Tramadol Nausea Only    Social History   Substance and Sexual Activity  Alcohol Use No  . Alcohol/week: 0.0 oz    Social History   Tobacco Use  Smoking Status Never Smoker  Smokeless Tobacco Never Used  Tobacco Comment   Never smoked    Review of Systems  HENT: Negative.   Eyes: Negative.   Respiratory: Positive for cough.   Cardiovascular: Negative.   Gastrointestinal: Positive for abdominal pain and heartburn.  Genitourinary: Positive for frequency.  Musculoskeletal: Positive for back pain and joint pain.  Skin: Positive for rash.  Neurological: Negative.   Endo/Heme/Allergies: Negative.   Psychiatric/Behavioral: Negative.     Objective   Vitals:   08/19/17 0949  BP: 139/70  Pulse: 65  Temp: (!) 97.5 F (36.4 C)    Physical Exam  Constitutional: She is oriented to person, place, and time and well-developed, well-nourished, and in no distress.  HENT:  Head: Normocephalic and atraumatic.  Eyes: No scleral icterus.  Cardiovascular: Normal rate, regular rhythm and normal heart sounds. Exam reveals no gallop and no friction rub.  No murmur heard. Pulmonary/Chest: Effort normal and breath sounds normal. No respiratory distress. She has no wheezes. She has no rales.  Abdominal: Soft. Bowel sounds are normal. She exhibits no distension. There is tenderness. There is no rebound and no guarding.  Tender in the right upper quadrant to deep palpation.  Neurological: She is alert and oriented to person, place, and time.  Skin: Skin is warm and dry.  Vitals reviewed. GI office notes reviewed.  HIDA scan report reviewed.  Assessment   Chronic cholecystitis Plan   Patient is scheduled for laparoscopic cholecystectomy on 08/27/2017.  The risks and benefits of the procedure including bleeding, infection, hepatobiliary injury, and the possibility of an open procedure were fully explained to the patient, who gave informed consent.

## 2017-08-19 NOTE — Patient Instructions (Signed)

## 2017-08-20 NOTE — Patient Instructions (Signed)
Ruth Hughes  08/20/2017     @PREFPERIOPPHARMACY @   Your procedure is scheduled on  08/27/2017 .  Report to [facility] at  615   A.M.  Call this number if you have problems the morning of surgery:  [phone]   Remember:  Do not eat food or drink liquids after midnight.  Take these medicines the morning of surgery with A SIP OF WATER  Zyrtec, metoprolol.   Do not wear jewelry, make-up or nail polish.  Do not wear lotions, powders, or perfumes, or deodorant.  Do not shave 48 hours prior to surgery.  Men may shave face and neck.  Do not bring valuables to the hospital.  [facility] is not responsible for any belongings or valuables.  Contacts, dentures or bridgework may not be worn into surgery.  Leave your suitcase in the car.  After surgery it may be brought to your room.  For patients admitted to the hospital, discharge time will be determined by your treatment team.  Patients discharged the day of surgery will not be allowed to drive home.   Name and phone number of your driver:   family Special instructions:  None  Please read over the following fact sheets that you were given. Anesthesia Post-op Instructions and Care and Recovery After Surgery       Laparoscopic Cholecystectomy Laparoscopic cholecystectomy is surgery to remove the gallbladder. The gallbladder is a pear-shaped organ that lies beneath the liver on the right side of the body. The gallbladder stores bile, which is a fluid that helps the body to digest fats. Cholecystectomy is often done for inflammation of the gallbladder (cholecystitis). This condition is usually caused by a buildup of gallstones (cholelithiasis) in the gallbladder. Gallstones can block the flow of bile, which can result in inflammation and pain. In severe cases, emergency surgery may be required. This procedure is done though small incisions in your abdomen (laparoscopic surgery). A thin scope with a camera (laparoscope)  is inserted through one incision. Thin surgical instruments are inserted through the other incisions. In some cases, a laparoscopic procedure may be turned into a type of surgery that is done through a larger incision (open surgery). Tell a health care provider about:  Any allergies you have.  All medicines you are taking, including vitamins, herbs, eye drops, creams, and over-the-counter medicines.  Any problems you or family members have had with anesthetic medicines.  Any blood disorders you have.  Any surgeries you have had.  Any medical conditions you have.  Whether you are pregnant or may be pregnant. What are the risks? Generally, this is a safe procedure. However, problems may occur, including:  Infection.  Bleeding.  Allergic reactions to medicines.  Damage to other structures or organs.  A stone remaining in the common bile duct. The common bile duct carries bile from the gallbladder into the small intestine.  A bile leak from the cyst duct that is clipped when your gallbladder is removed.  What happens before the procedure? Staying hydrated Follow instructions from your health care provider about hydration, which may include:  Up to 2 hours before the procedure - you may continue to drink clear liquids, such as water, clear fruit juice, black coffee, and plain tea.  Eating and drinking restrictions Follow instructions from your health care provider about eating and drinking, which may include:  8 hours before the procedure - stop eating heavy meals or foods such  as meat, fried foods, or fatty foods.  6 hours before the procedure - stop eating light meals or foods, such as toast or cereal.  6 hours before the procedure - stop drinking milk or drinks that contain milk.  2 hours before the procedure - stop drinking clear liquids.  Medicines  Ask your health care provider about: ? Changing or stopping your regular medicines. This is especially important if  you are taking diabetes medicines or blood thinners. ? Taking medicines such as aspirin and ibuprofen. These medicines can thin your blood. Do not take these medicines before your procedure if your health care provider instructs you not to.  You may be given antibiotic medicine to help prevent infection. General instructions  Let your health care provider know if you develop a cold or an infection before surgery.  Plan to have someone take you home from the hospital or clinic.  Ask your health care provider how your surgical site will be marked or identified. What happens during the procedure?  To reduce your risk of infection: ? Your health care team will wash or sanitize their hands. ? Your skin will be washed with soap. ? Hair may be removed from the surgical area.  An IV tube may be inserted into one of your veins.  You will be given one or more of the following: ? A medicine to help you relax (sedative). ? A medicine to make you fall asleep (general anesthetic).  A breathing tube will be placed in your mouth.  Your surgeon will make several small cuts (incisions) in your abdomen.  The laparoscope will be inserted through one of the small incisions. The camera on the laparoscope will send images to a TV screen (monitor) in the operating room. This lets your surgeon see inside your abdomen.  Air-like gas will be pumped into your abdomen. This will expand your abdomen to give the surgeon more room to perform the surgery.  Other tools that are needed for the procedure will be inserted through the other incisions. The gallbladder will be removed through one of the incisions.  Your common bile duct may be examined. If stones are found in the common bile duct, they may be removed.  After your gallbladder has been removed, the incisions will be closed with stitches (sutures), staples, or skin glue.  Your incisions may be covered with a bandage (dressing). The procedure may vary  among health care providers and hospitals. What happens after the procedure?  Your blood pressure, heart rate, breathing rate, and blood oxygen level will be monitored until the medicines you were given have worn off.  You will be given medicines as needed to control your pain.  Do not drive for 24 hours if you were given a sedative. This information is not intended to replace advice given to you by your health care provider. Make sure you discuss any questions you have with your health care provider. Document Released: 05/18/2005 Document Revised: 12/08/2015 Document Reviewed: 11/04/2015 Elsevier Interactive Patient Education  2018 [employer].  Laparoscopic Cholecystectomy, Care After This sheet gives you information about how to care for yourself after your procedure. Your health care provider may also give you more specific instructions. If you have problems or questions, contact your health care provider. What can I expect after the procedure? After the procedure, it is common to have:  Pain at your incision sites. You will be given medicines to control this pain.  Mild nausea or vomiting.  Bloating and  possible shoulder pain from the air-like gas that was used during the procedure.  Follow these instructions at home: Incision care   Follow instructions from your health care provider about how to take care of your incisions. Make sure you: ? Wash your hands with soap and water before you change your bandage (dressing). If soap and water are not available, use hand sanitizer. ? Change your dressing as told by your health care provider. ? Leave stitches (sutures), skin glue, or adhesive strips in place. These skin closures may need to be in place for 2 weeks or longer. If adhesive strip edges start to loosen and curl up, you may trim the loose edges. Do not remove adhesive strips completely unless your health care provider tells you to do that.  Do not take baths, swim, or use a  hot tub until your health care provider approves. Ask your health care provider if you can take showers. You may only be allowed to take sponge baths for bathing.  Check your incision area every day for signs of infection. Check for: ? More redness, swelling, or pain. ? More fluid or blood. ? Warmth. ? Pus or a bad smell. Activity  Do not drive or use heavy machinery while taking prescription pain medicine.  Do not lift anything that is heavier than 10 lb (4.5 kg) until your health care provider approves.  Do not play contact sports until your health care provider approves.  Do not drive for 24 hours if you were given a medicine to help you relax (sedative).  Rest as needed. Do not return to work or school until your health care provider approves. General instructions  Take over-the-counter and prescription medicines only as told by your health care provider.  To prevent or treat constipation while you are taking prescription pain medicine, your health care provider may recommend that you: ? Drink enough fluid to keep your urine clear or pale yellow. ? Take over-the-counter or prescription medicines. ? Eat foods that are high in fiber, such as fresh fruits and vegetables, whole grains, and beans. ? Limit foods that are high in fat and processed sugars, such as fried and sweet foods. Contact a health care provider if:  You develop a rash.  You have more redness, swelling, or pain around your incisions.  You have more fluid or blood coming from your incisions.  Your incisions feel warm to the touch.  You have pus or a bad smell coming from your incisions.  You have a fever.  One or more of your incisions breaks open. Get help right away if:  You have trouble breathing.  You have chest pain.  You have increasing pain in your shoulders.  You faint or feel dizzy when you stand.  You have severe pain in your abdomen.  You have nausea or vomiting that lasts for more  than one day.  You have leg pain. This information is not intended to replace advice given to you by your health care provider. Make sure you discuss any questions you have with your health care provider. Document Released: 05/18/2005 Document Revised: 12/07/2015 Document Reviewed: 11/04/2015 Elsevier Interactive Patient Education  2018 [place] Anesthesia, Adult General anesthesia is the use of medicines to make a person "go to sleep" (be unconscious) for a medical procedure. General anesthesia is often recommended when a procedure:  Is long.  Requires you to be still or in an unusual position.  Is major and can cause you to  lose blood.  Is impossible to do without general anesthesia.  The medicines used for general anesthesia are called general anesthetics. In addition to making you sleep, the medicines:  Prevent pain.  Control your blood pressure.  Relax your muscles.  Tell a health care provider about:  Any allergies you have.  All medicines you are taking, including vitamins, herbs, eye drops, creams, and over-the-counter medicines.  Any problems you or family members have had with anesthetic medicines.  Types of anesthetics you have had in the past.  Any bleeding disorders you have.  Any surgeries you have had.  Any medical conditions you have.  Any history of heart or lung conditions, such as heart failure, sleep apnea, or chronic obstructive pulmonary disease (COPD).  Whether you are pregnant or may be pregnant.  Whether you use tobacco, alcohol, marijuana, or street drugs.  Any history of Armed forces logistics/support/administrative officer.  Any history of depression or anxiety. What are the risks? Generally, this is a safe procedure. However, problems may occur, including:  Allergic reaction to anesthetics.  Lung and heart problems.  Inhaling food or liquids from your stomach into your lungs (aspiration).  Injury to nerves.  Waking up during your procedure and  being unable to move (rare).  Extreme agitation or a state of mental confusion (delirium) when you wake up from the anesthetic.  Air in the bloodstream, which can lead to stroke.  These problems are more likely to develop if you are having a major surgery or if you have an advanced medical condition. You can prevent some of these complications by answering all of your health care provider's questions thoroughly and by following all pre-procedure instructions. General anesthesia can cause side effects, including:  Nausea or vomiting  A sore throat from the breathing tube.  Feeling cold or shivery.  Feeling tired, washed out, or achy.  Sleepiness or drowsiness.  Confusion or agitation.  What happens before the procedure? Staying hydrated Follow instructions from your health care provider about hydration, which may include:  Up to 2 hours before the procedure - you may continue to drink clear liquids, such as water, clear fruit juice, black coffee, and plain tea.  Eating and drinking restrictions Follow instructions from your health care provider about eating and drinking, which may include:  8 hours before the procedure - stop eating heavy meals or foods such as meat, fried foods, or fatty foods.  6 hours before the procedure - stop eating light meals or foods, such as toast or cereal.  6 hours before the procedure - stop drinking milk or drinks that contain milk.  2 hours before the procedure - stop drinking clear liquids.  Medicines  Ask your health care provider about: ? Changing or stopping your regular medicines. This is especially important if you are taking diabetes medicines or blood thinners. ? Taking medicines such as aspirin and ibuprofen. These medicines can thin your blood. Do not take these medicines before your procedure if your health care provider instructs you not to. ? Taking new dietary supplements or medicines. Do not take these during the week before  your procedure unless your health care provider approves them.  If you are told to take a medicine or to continue taking a medicine on the day of the procedure, take the medicine with sips of water. General instructions   Ask if you will be going home the same day, the following day, or after a longer hospital stay. ? Plan to have someone take you  home. ? Plan to have someone stay with you for the first 24 hours after you leave the hospital or clinic.  For 3-6 weeks before the procedure, try not to use any tobacco products, such as cigarettes, chewing tobacco, and e-cigarettes.  You may brush your teeth on the morning of the procedure, but make sure to spit out the toothpaste. What happens during the procedure?  You will be given anesthetics through a mask and through an IV tube in one of your veins.  You may receive medicine to help you relax (sedative).  As soon as you are asleep, a breathing tube may be used to help you breathe.  An anesthesia specialist will stay with you throughout the procedure. He or she will help keep you comfortable and safe by continuing to give you medicines and adjusting the amount of medicine that you get. He or she will also watch your blood pressure, pulse, and oxygen levels to make sure that the anesthetics do not cause any problems.  If a breathing tube was used to help you breathe, it will be removed before you wake up. The procedure may vary among health care providers and hospitals. What happens after the procedure?  You will wake up, often slowly, after the procedure is complete, usually in a recovery area.  Your blood pressure, heart rate, breathing rate, and blood oxygen level will be monitored until the medicines you were given have worn off.  You may be given medicine to help you calm down if you feel anxious or agitated.  If you will be going home the same day, your health care provider may check to make sure you can stand, drink, and  urinate.  Your health care providers will treat your pain and side effects before you go home.  Do not drive for 24 hours if you received a sedative.  You may: ? Feel nauseous and vomit. ? Have a sore throat. ? Have mental slowness. ? Feel cold or shivery. ? Feel sleepy. ? Feel tired. ? Feel sore or achy, even in parts of your body where you did not have surgery. This information is not intended to replace advice given to you by your health care provider. Make sure you discuss any questions you have with your health care provider. Document Released: 08/25/2007 Document Revised: 10/29/2015 Document Reviewed: 05/02/2015 Elsevier Interactive Patient Education  2018 [place] Anesthesia, Adult, Care After These instructions provide you with information about caring for yourself after your procedure. Your health care provider may also give you more specific instructions. Your treatment has been planned according to current medical practices, but problems sometimes occur. Call your health care provider if you have any problems or questions after your procedure. What can I expect after the procedure? After the procedure, it is common to have:  Vomiting.  A sore throat.  Mental slowness.  It is common to feel:  Nauseous.  Cold or shivery.  Sleepy.  Tired.  Sore or achy, even in parts of your body where you did not have surgery.  Follow these instructions at home: For at least 24 hours after the procedure:  Do not: ? Participate in activities where you could fall or become injured. ? Drive. ? Use heavy machinery. ? Drink alcohol. ? Take sleeping pills or medicines that cause drowsiness. ? Make important decisions or sign legal documents. ? Take care of children on your own.  Rest. Eating and drinking  If you vomit, drink water, juice, or soup  when you can drink without vomiting.  Drink enough fluid to keep your urine clear or pale yellow.  Make sure you  have little or no nausea before eating solid foods.  Follow the diet recommended by your health care provider. General instructions  Have a responsible adult stay with you until you are awake and alert.  Return to your normal activities as told by your health care provider. Ask your health care provider what activities are safe for you.  Take over-the-counter and prescription medicines only as told by your health care provider.  If you smoke, do not smoke without supervision.  Keep all follow-up visits as told by your health care provider. This is important. Contact a health care provider if:  You continue to have nausea or vomiting at home, and medicines are not helpful.  You cannot drink fluids or start eating again.  You cannot urinate after 8-12 hours.  You develop a skin rash.  You have fever.  You have increasing redness at the site of your procedure. Get help right away if:  You have difficulty breathing.  You have chest pain.  You have unexpected bleeding.  You feel that you are having a life-threatening or urgent problem. This information is not intended to replace advice given to you by your health care provider. Make sure you discuss any questions you have with your health care provider. Document Released: 08/24/2000 Document Revised: 10/21/2015 Document Reviewed: 05/02/2015 Elsevier Interactive Patient Education  [employer].

## 2017-08-23 ENCOUNTER — Encounter (HOSPITAL_COMMUNITY): Payer: Self-pay

## 2017-08-23 ENCOUNTER — Other Ambulatory Visit: Payer: Self-pay

## 2017-08-23 ENCOUNTER — Encounter (HOSPITAL_COMMUNITY)
Admission: RE | Admit: 2017-08-23 | Discharge: 2017-08-23 | Disposition: A | Discharge status: Home / Self Care | Payer: Medicare Other | Source: Ambulatory Visit | Attending: General Surgery | Admitting: General Surgery

## 2017-08-23 DIAGNOSIS — Z01818 Encounter for other preprocedural examination: Secondary | ICD-10-CM | POA: Diagnosis not present

## 2017-08-23 HISTORY — DX: Depression, unspecified: F32.A

## 2017-08-23 HISTORY — DX: Other specified postprocedural states: Z98.890

## 2017-08-23 HISTORY — DX: Nausea with vomiting, unspecified: R11.2

## 2017-08-23 HISTORY — DX: Anxiety disorder, unspecified: F41.9

## 2017-08-23 HISTORY — DX: Major depressive disorder, single episode, unspecified: F32.9

## 2017-08-23 LAB — CBC WITH DIFFERENTIAL/PLATELET
Basophils Absolute: 0.1 10*3/uL (ref 0.0–0.1)
Basophils Relative: 1 %
EOS PCT: 1 %
Eosinophils Absolute: 0.1 10*3/uL (ref 0.0–0.7)
HEMATOCRIT: 36.1 % (ref 36.0–46.0)
Hemoglobin: 12.3 g/dL (ref 12.0–15.0)
Lymphocytes Relative: 39 %
Lymphs Abs: 3.3 10*3/uL (ref 0.7–4.0)
MCH: 23.4 pg — AB (ref 26.0–34.0)
MCHC: 34.1 g/dL (ref 30.0–36.0)
MCV: 68.6 fL — AB (ref 78.0–100.0)
MONO ABS: 0.6 10*3/uL (ref 0.1–1.0)
MONOS PCT: 7 %
Neutro Abs: 4.3 10*3/uL (ref 1.7–7.7)
Neutrophils Relative %: 52 %
Platelets: 354 10*3/uL (ref 150–400)
RBC: 5.26 MIL/uL — ABNORMAL HIGH (ref 3.87–5.11)
RDW: 21.7 % — AB (ref 11.5–15.5)
WBC: 8.3 10*3/uL (ref 4.0–10.5)

## 2017-08-23 LAB — COMPREHENSIVE METABOLIC PANEL
ALK PHOS: 74 U/L (ref 38–126)
ALT: 12 U/L — ABNORMAL LOW (ref 14–54)
ANION GAP: 10 (ref 5–15)
AST: 16 U/L (ref 15–41)
Albumin: 3.9 g/dL (ref 3.5–5.0)
BILIRUBIN TOTAL: 1 mg/dL (ref 0.3–1.2)
BUN: 20 mg/dL (ref 6–20)
CALCIUM: 10.7 mg/dL — AB (ref 8.9–10.3)
CO2: 22 mmol/L (ref 22–32)
Chloride: 107 mmol/L (ref 101–111)
Creatinine, Ser: 0.81 mg/dL (ref 0.44–1.00)
GLUCOSE: 82 mg/dL (ref 65–99)
Potassium: 3.7 mmol/L (ref 3.5–5.1)
Sodium: 139 mmol/L (ref 135–145)
TOTAL PROTEIN: 7 g/dL (ref 6.5–8.1)

## 2017-08-27 ENCOUNTER — Ambulatory Visit (HOSPITAL_COMMUNITY): Payer: Medicare Other | Admitting: Anesthesiology

## 2017-08-27 ENCOUNTER — Encounter (HOSPITAL_COMMUNITY)
Admission: RE | Disposition: A | Discharge status: Home / Self Care | Payer: Self-pay | Source: Ambulatory Visit | Attending: General Surgery

## 2017-08-27 ENCOUNTER — Ambulatory Visit (HOSPITAL_COMMUNITY)
Admission: RE | Admit: 2017-08-27 | Discharge: 2017-08-27 | Disposition: A | Discharge status: Home / Self Care | Payer: Medicare Other | Source: Ambulatory Visit | Attending: General Surgery | Admitting: General Surgery

## 2017-08-27 DIAGNOSIS — I10 Essential (primary) hypertension: Secondary | ICD-10-CM | POA: Insufficient documentation

## 2017-08-27 DIAGNOSIS — Z79899 Other long term (current) drug therapy: Secondary | ICD-10-CM | POA: Diagnosis not present

## 2017-08-27 DIAGNOSIS — Z7982 Long term (current) use of aspirin: Secondary | ICD-10-CM | POA: Diagnosis not present

## 2017-08-27 DIAGNOSIS — E785 Hyperlipidemia, unspecified: Secondary | ICD-10-CM | POA: Insufficient documentation

## 2017-08-27 DIAGNOSIS — Z888 Allergy status to other drugs, medicaments and biological substances status: Secondary | ICD-10-CM | POA: Insufficient documentation

## 2017-08-27 DIAGNOSIS — K219 Gastro-esophageal reflux disease without esophagitis: Secondary | ICD-10-CM | POA: Diagnosis not present

## 2017-08-27 DIAGNOSIS — Z96641 Presence of right artificial hip joint: Secondary | ICD-10-CM | POA: Diagnosis not present

## 2017-08-27 DIAGNOSIS — F329 Major depressive disorder, single episode, unspecified: Secondary | ICD-10-CM | POA: Insufficient documentation

## 2017-08-27 DIAGNOSIS — K449 Diaphragmatic hernia without obstruction or gangrene: Secondary | ICD-10-CM | POA: Insufficient documentation

## 2017-08-27 DIAGNOSIS — K811 Chronic cholecystitis: Secondary | ICD-10-CM | POA: Diagnosis present

## 2017-08-27 DIAGNOSIS — F419 Anxiety disorder, unspecified: Secondary | ICD-10-CM | POA: Diagnosis not present

## 2017-08-27 HISTORY — PX: CHOLECYSTECTOMY: SHX55

## 2017-08-27 SURGERY — LAPAROSCOPIC CHOLECYSTECTOMY
Anesthesia: General

## 2017-08-27 MED ORDER — LACTATED RINGERS IV SOLN
INTRAVENOUS | Status: DC
  Administered 2017-08-27: 1000 mL via INTRAVENOUS

## 2017-08-27 MED ORDER — SODIUM CHLORIDE 0.9 % IR SOLN
Status: DC | PRN
  Administered 2017-08-27: 1000 mL

## 2017-08-27 MED ORDER — CHLORHEXIDINE GLUCONATE CLOTH 2 % EX PADS: 6.0000 | MEDICATED_PAD | Freq: Once | CUTANEOUS | Status: DC

## 2017-08-27 MED ORDER — MIDAZOLAM HCL 5 MG/5ML IJ SOLN
INTRAMUSCULAR | Status: DC | PRN
  Administered 2017-08-27: 2 mg via INTRAVENOUS

## 2017-08-27 MED ORDER — DEXAMETHASONE SODIUM PHOSPHATE 4 MG/ML IJ SOLN
INTRAMUSCULAR | Status: AC
  Filled 2017-08-27: qty 1

## 2017-08-27 MED ORDER — FENTANYL CITRATE (PF) 250 MCG/5ML IJ SOLN
INTRAMUSCULAR | Status: AC
  Filled 2017-08-27: qty 5

## 2017-08-27 MED ORDER — HEMOSTATIC AGENTS (NO CHARGE) OPTIME
TOPICAL | Status: DC | PRN
  Administered 2017-08-27: 1 via TOPICAL

## 2017-08-27 MED ORDER — MIDAZOLAM HCL 2 MG/2ML IJ SOLN
1.0000 mg | INTRAMUSCULAR | Status: AC
  Administered 2017-08-27: 2 mg via INTRAVENOUS

## 2017-08-27 MED ORDER — MIDAZOLAM HCL 2 MG/2ML IJ SOLN
INTRAMUSCULAR | Status: AC
  Filled 2017-08-27: qty 2

## 2017-08-27 MED ORDER — POVIDONE-IODINE 10 % EX OINT
TOPICAL_OINTMENT | CUTANEOUS | Status: AC
Start: 2017-08-27 — End: ?
  Filled 2017-08-27: qty 1

## 2017-08-27 MED ORDER — HYDROCODONE-ACETAMINOPHEN 5-325 MG PO TABS: 1.0000 | ORAL_TABLET | Freq: Four times a day (QID) | ORAL | 0 refills | Status: DC | PRN

## 2017-08-27 MED ORDER — BUPIVACAINE HCL (PF) 0.5 % IJ SOLN
INTRAMUSCULAR | Status: DC | PRN
  Administered 2017-08-27: 10 mL

## 2017-08-27 MED ORDER — DEXAMETHASONE SODIUM PHOSPHATE 4 MG/ML IJ SOLN
4.0000 mg | Freq: Once | INTRAMUSCULAR | Status: AC
  Administered 2017-08-27: 4 mg via INTRAVENOUS

## 2017-08-27 MED ORDER — LIDOCAINE HCL 1 % IJ SOLN
INTRAMUSCULAR | Status: DC | PRN
  Administered 2017-08-27: 25 mg via INTRADERMAL

## 2017-08-27 MED ORDER — POVIDONE-IODINE 10 % OINT PACKET
TOPICAL_OINTMENT | CUTANEOUS | Status: DC | PRN
  Administered 2017-08-27: 1 via TOPICAL

## 2017-08-27 MED ORDER — SCOPOLAMINE 1 MG/3DAYS TD PT72
MEDICATED_PATCH | TRANSDERMAL | Status: AC
  Filled 2017-08-27: qty 1

## 2017-08-27 MED ORDER — SUGAMMADEX SODIUM 500 MG/5ML IV SOLN
INTRAVENOUS | Status: AC
  Filled 2017-08-27: qty 5

## 2017-08-27 MED ORDER — FENTANYL CITRATE (PF) 100 MCG/2ML IJ SOLN
25.0000 ug | INTRAMUSCULAR | Status: DC | PRN
  Administered 2017-08-27 (×2): 25 ug via INTRAVENOUS
  Filled 2017-08-27: qty 2

## 2017-08-27 MED ORDER — ONDANSETRON HCL 4 MG/2ML IJ SOLN
INTRAMUSCULAR | Status: AC
  Filled 2017-08-27: qty 2

## 2017-08-27 MED ORDER — BUPIVACAINE HCL (PF) 0.5 % IJ SOLN
INTRAMUSCULAR | Status: AC
  Filled 2017-08-27: qty 30

## 2017-08-27 MED ORDER — CIPROFLOXACIN IN D5W 400 MG/200ML IV SOLN
INTRAVENOUS | Status: AC
  Filled 2017-08-27: qty 200

## 2017-08-27 MED ORDER — PROPOFOL 10 MG/ML IV BOLUS
INTRAVENOUS | Status: AC
  Filled 2017-08-27: qty 40

## 2017-08-27 MED ORDER — ONDANSETRON HCL 4 MG/2ML IJ SOLN
4.0000 mg | Freq: Once | INTRAMUSCULAR | Status: AC
  Administered 2017-08-27: 4 mg via INTRAVENOUS

## 2017-08-27 MED ORDER — ROCURONIUM BROMIDE 50 MG/5ML IV SOLN
INTRAVENOUS | Status: AC
  Filled 2017-08-27: qty 1

## 2017-08-27 MED ORDER — FENTANYL CITRATE (PF) 100 MCG/2ML IJ SOLN
INTRAMUSCULAR | Status: DC | PRN
  Administered 2017-08-27 (×2): 50 ug via INTRAVENOUS

## 2017-08-27 MED ORDER — PROPOFOL 10 MG/ML IV BOLUS
INTRAVENOUS | Status: DC | PRN
  Administered 2017-08-27: 180 mg via INTRAVENOUS

## 2017-08-27 MED ORDER — LIDOCAINE HCL (PF) 1 % IJ SOLN
INTRAMUSCULAR | Status: AC
  Filled 2017-08-27: qty 5

## 2017-08-27 MED ORDER — CIPROFLOXACIN IN D5W 400 MG/200ML IV SOLN
400.0000 mg | INTRAVENOUS | Status: AC
  Administered 2017-08-27: 400 mg via INTRAVENOUS

## 2017-08-27 MED ORDER — SUGAMMADEX SODIUM 500 MG/5ML IV SOLN
INTRAVENOUS | Status: DC | PRN
  Administered 2017-08-27: 400 mg via INTRAVENOUS

## 2017-08-27 MED ORDER — ROCURONIUM BROMIDE 100 MG/10ML IV SOLN
INTRAVENOUS | Status: DC | PRN
  Administered 2017-08-27: 35 mg via INTRAVENOUS
  Administered 2017-08-27: 5 mg via INTRAVENOUS

## 2017-08-27 MED ORDER — SCOPOLAMINE 1 MG/3DAYS TD PT72
1.0000 | MEDICATED_PATCH | Freq: Once | TRANSDERMAL | Status: DC
  Administered 2017-08-27: 1.5 mg via TRANSDERMAL

## 2017-08-27 SURGICAL SUPPLY — 45 items
APPLIER CLIP ROT 10 11.4 M/L (STAPLE) ×2
BAG HAMPER (MISCELLANEOUS) ×2 IMPLANT
BAG RETRIEVAL 10 (BASKET) ×1
CHLORAPREP W/TINT 26ML (MISCELLANEOUS) ×2 IMPLANT
CLIP APPLIE ROT 10 11.4 M/L (STAPLE) ×1 IMPLANT
CLOTH BEACON ORANGE TIMEOUT ST (SAFETY) ×2 IMPLANT
COVER LIGHT HANDLE STERIS (MISCELLANEOUS) ×4 IMPLANT
DECANTER SPIKE VIAL GLASS SM (MISCELLANEOUS) ×2 IMPLANT
ELECT REM PT RETURN 9FT ADLT (ELECTROSURGICAL) ×2
ELECTRODE REM PT RTRN 9FT ADLT (ELECTROSURGICAL) ×1 IMPLANT
FILTER SMOKE EVAC LAPAROSHD (FILTER) ×2 IMPLANT
GLOVE BIOGEL PI IND STRL 6.5 (GLOVE) ×1 IMPLANT
GLOVE BIOGEL PI IND STRL 7.0 (GLOVE) ×1 IMPLANT
GLOVE BIOGEL PI INDICATOR 6.5 (GLOVE) ×1
GLOVE BIOGEL PI INDICATOR 7.0 (GLOVE) ×1
GLOVE ECLIPSE 6.5 STRL STRAW (GLOVE) ×2 IMPLANT
GLOVE SURG SS PI 6.5 STRL IVOR (GLOVE) ×2 IMPLANT
GLOVE SURG SS PI 7.5 STRL IVOR (GLOVE) ×2 IMPLANT
GOWN STRL REUS W/ TWL XL LVL3 (GOWN DISPOSABLE) ×1 IMPLANT
GOWN STRL REUS W/TWL LRG LVL3 (GOWN DISPOSABLE) ×4 IMPLANT
GOWN STRL REUS W/TWL XL LVL3 (GOWN DISPOSABLE) ×1
HEMOSTAT SNOW SURGICEL 2X4 (HEMOSTASIS) ×2 IMPLANT
INST SET LAPROSCOPIC AP (KITS) ×2 IMPLANT
KIT TURNOVER KIT A (KITS) ×2 IMPLANT
MANIFOLD NEPTUNE II (INSTRUMENTS) ×2 IMPLANT
NEEDLE HYPO 25X1 1.5 SAFETY (NEEDLE) ×2 IMPLANT
NEEDLE INSUFFLATION 14GA 120MM (NEEDLE) ×2 IMPLANT
NS IRRIG 1000ML POUR BTL (IV SOLUTION) ×2 IMPLANT
PACK LAP CHOLE LZT030E (CUSTOM PROCEDURE TRAY) ×2 IMPLANT
PAD ARMBOARD 7.5X6 YLW CONV (MISCELLANEOUS) ×2 IMPLANT
SET BASIN LINEN APH (SET/KITS/TRAYS/PACK) ×2 IMPLANT
SET TUBE IRRIG SUCTION NO TIP (IRRIGATION / IRRIGATOR) IMPLANT
SLEEVE ENDOPATH XCEL 5M (ENDOMECHANICALS) ×2 IMPLANT
SPONGE GAUZE 2X2 8PLY STRL LF (GAUZE/BANDAGES/DRESSINGS) ×8 IMPLANT
STAPLER VISISTAT (STAPLE) ×2 IMPLANT
SUT VICRYL 0 UR6 27IN ABS (SUTURE) ×2 IMPLANT
SYS BAG RETRIEVAL 10MM (BASKET) ×1
SYSTEM BAG RETRIEVAL 10MM (BASKET) ×1 IMPLANT
TAPE CLOTH SURG 4X10 WHT LF (GAUZE/BANDAGES/DRESSINGS) ×2 IMPLANT
TROCAR ENDO BLADELESS 11MM (ENDOMECHANICALS) ×2 IMPLANT
TROCAR XCEL NON-BLD 5MMX100MML (ENDOMECHANICALS) ×2 IMPLANT
TROCAR XCEL UNIV SLVE 11M 100M (ENDOMECHANICALS) ×2 IMPLANT
TUBE CONNECTING 12X1/4 (SUCTIONS) ×2 IMPLANT
TUBING INSUFFLATION (TUBING) ×2 IMPLANT
WARMER LAPAROSCOPE (MISCELLANEOUS) ×2 IMPLANT

## 2017-08-27 NOTE — Interval H&P Note (Signed)
History and Physical Interval Note:  08/27/2017 7:13 AM  Ruth Hughes  has presented today for surgery, with the diagnosis of chronic cholecystitis  The various methods of treatment have been discussed with the patient and family. After consideration of risks, benefits and other options for treatment, the patient has consented to  Procedure(s): LAPAROSCOPIC CHOLECYSTECTOMY (N/A) as a surgical intervention .  The patient's history has been reviewed, patient examined, no change in status, stable for surgery.  I have reviewed the patient's chart and labs.  Questions were answered to the patient's satisfaction.     Aviva Signs

## 2017-08-27 NOTE — Anesthesia Procedure Notes (Signed)
Procedure Name: Intubation Date/Time: 08/27/2017 7:44 AM Performed by: Charmaine Downs, CRNA Pre-anesthesia Checklist: Patient being monitored, Suction available, Emergency Drugs available and Patient identified Patient Re-evaluated:Patient Re-evaluated prior to induction Oxygen Delivery Method: Circle system utilized Preoxygenation: Pre-oxygenation with 100% oxygen Induction Type: IV induction Ventilation: Mask ventilation without difficulty Laryngoscope Size: Mac and 4 (attempted with poor visualization.) Grade View: Grade III Tube size: 7.0 mm Number of attempts: 2 Airway Equipment and Method: Video-laryngoscopy and Rigid stylet Placement Confirmation: ETT inserted through vocal cords under direct vision,  positive ETCO2 and breath sounds checked- equal and bilateral Secured at: 20 cm Tube secured with: Tape Dental Injury: Teeth and Oropharynx as per pre-operative assessment  Difficulty Due To: Difficulty was unanticipated and Difficult Airway- due to reduced neck mobility Future Recommendations: Recommend- induction with short-acting agent, and alternative techniques readily available

## 2017-08-27 NOTE — Op Note (Signed)
Patient:  Ruth Hughes  DOB:  [birth year]  MRN:  [id number]   Preop Diagnosis: Chronic cholecystitis  Postop Diagnosis: Same  Procedure: Laparoscopic cholecystectomy  Surgeon: Aviva Signs, MD  Anes: General endotracheal  Indications: Patient is a 62 year old black female who presents with abdominal pain secondary to chronic cholecystitis.  The risks and benefits of the procedure including bleeding, infection, hepatobiliary injury, and the possibility of an open procedure were fully explained to the patient, who gave informed consent.  Procedure note: The patient was placed in supine position.  After induction of general endotracheal anesthesia, the abdomen was prepped and draped using the usual sterile technique with DuraPrep.  Surgical site confirmation was performed.  A supraumbilical incision was made down to the fascia.  A Veress needle was introduced into the abdominal cavity and confirmation of placement was done using the saline drop test.  The abdomen was then insufflated to 16 mmHg pressure.  An 11 mm trocar was introduced into the abdominal cavity under direct visualization without difficulty.  The patient was placed in reverse Trendelenburg position and an additional 11 mm trocar was placed in the epigastric region and 5 mm trochars were placed in the upper quadrant and right flank regions.  The liver was inspected and noted to be age-appropriate.  The gallbladder was retracted in a dynamic fashion in order to provide a critical view of the triangle of Calot.  The cystic duct was first identified.  Its juncture to the infundibulum was fully identified.  Endoclips were placed proximally and distally on the cystic duct, and the cystic duct was divided.  This was likewise done the cystic artery.  The gallbladder was freed away from the gallbladder fossa using Bovie electrocautery.  The gallbladder was delivered through the epigastric trocar site using an Endo Catch bag.  The gallbladder  fossa was inspected and no abnormal bleeding or bile leakage was noted.  Surgicel was placed in the gallbladder fossa.  All fluid and air were then evacuated from the abdominal cavity prior to the removal of the trochars.  All wounds were irrigated with normal saline.  All wounds were injected with 0.5% Sensorcaine.  The supraumbilical fascia was reapproximated using an 0 Vicryl interrupted suture.  All skin incisions were closed using staples.  Betadine ointment and dry sterile dressings were applied.  All tape and needle counts were correct at the end of the procedure.  The patient was extubated in the operating room and transferred to PACU in stable condition.  Complications: None  EBL: Minimal  Specimen: Gallbladder

## 2017-08-27 NOTE — Anesthesia Preprocedure Evaluation (Signed)
Anesthesia Evaluation  Patient identified by MRN, date of birth, ID band Patient awake    Reviewed: Allergy & Precautions, NPO status , Patient's Chart, lab work & pertinent test results, reviewed documented beta blocker date and time   History of Anesthesia Complications (+) PONV and history of anesthetic complications  Airway Mallampati: I  TM Distance: >3 FB Neck ROM: Full    Dental  (+) Teeth Intact   Pulmonary neg pulmonary ROS,    breath sounds clear to auscultation       Cardiovascular hypertension, Pt. on medications and Pt. on home beta blockers  Rhythm:Regular Rate:Normal     Neuro/Psych PSYCHIATRIC DISORDERS Anxiety Depression Spinal stenosis    GI/Hepatic Neg liver ROS, GERD  Medicated,  Endo/Other  Hx Goiter   Renal/GU negative Renal ROS     Musculoskeletal  (+) Arthritis ,   Abdominal   Peds  Hematology negative hematology ROS (+)   Anesthesia Other Findings   Reproductive/Obstetrics                             Anesthesia Physical Anesthesia Plan  ASA: II  Anesthesia Plan: General   Post-op Pain Management:    Induction: Intravenous, Rapid sequence and Cricoid pressure planned  PONV Risk Score and Plan:   Airway Management Planned: Oral ETT  Additional Equipment:   Intra-op Plan:   Post-operative Plan: Extubation in OR  Informed Consent: I have reviewed the patients History and Physical, chart, labs and discussed the procedure including the risks, benefits and alternatives for the proposed anesthesia with the patient or authorized representative who has indicated his/her understanding and acceptance.     Plan Discussed with:   Anesthesia Plan Comments:         Anesthesia Quick Evaluation

## 2017-08-27 NOTE — Discharge Instructions (Signed)
Laparoscopic Cholecystectomy, Care After °This sheet gives you information about how to care for yourself after your procedure. Your health care provider may also give you more specific instructions. If you have problems or questions, contact your health care provider. °What can I expect after the procedure? °After the procedure, it is common to have: °· Pain at your incision sites. You will be given medicines to control this pain. °· Mild nausea or vomiting. °· Bloating and possible shoulder pain from the air-like gas that was used during the procedure. ° °Follow these instructions at home: °Incision care ° °· Follow instructions from your health care provider about how to take care of your incisions. Make sure you: °? Wash your hands with soap and water before you change your bandage (dressing). If soap and water are not available, use hand sanitizer. °? Change your dressing as told by your health care provider. °? Leave stitches (sutures), skin glue, or adhesive strips in place. These skin closures may need to be in place for 2 weeks or longer. If adhesive strip edges start to loosen and curl up, you may trim the loose edges. Do not remove adhesive strips completely unless your health care provider tells you to do that. °· Do not take baths, swim, or use a hot tub until your health care provider approves. Ask your health care provider if you can take showers. You may only be allowed to take sponge baths for bathing. °· Check your incision area every day for signs of infection. Check for: °? More redness, swelling, or pain. °? More fluid or blood. °? Warmth. °? Pus or a bad smell. °Activity °· Do not drive or use heavy machinery while taking prescription pain medicine. °· Do not lift anything that is heavier than 10 lb (4.5 kg) until your health care provider approves. °· Do not play contact sports until your health care provider approves. °· Do not drive for 24 hours if you were given a medicine to help you relax  (sedative). °· Rest as needed. Do not return to work or school until your health care provider approves. °General instructions °· Take over-the-counter and prescription medicines only as told by your health care provider. °· To prevent or treat constipation while you are taking prescription pain medicine, your health care provider may recommend that you: °? Drink enough fluid to keep your urine clear or pale yellow. °? Take over-the-counter or prescription medicines. °? Eat foods that are high in fiber, such as fresh fruits and vegetables, whole grains, and beans. °? Limit foods that are high in fat and processed sugars, such as fried and sweet foods. °Contact a health care provider if: °· You develop a rash. °· You have more redness, swelling, or pain around your incisions. °· You have more fluid or blood coming from your incisions. °· Your incisions feel warm to the touch. °· You have pus or a bad smell coming from your incisions. °· You have a fever. °· One or more of your incisions breaks open. °Get help right away if: °· You have trouble breathing. °· You have chest pain. °· You have increasing pain in your shoulders. °· You faint or feel dizzy when you stand. °· You have severe pain in your abdomen. °· You have nausea or vomiting that lasts for more than one day. °· You have leg pain. °This information is not intended to replace advice given to you by your health care provider. Make sure you discuss any questions you   have with your health care provider. °Document Released: 05/18/2005 Document Revised: 12/07/2015 Document Reviewed: 11/04/2015 °Elsevier Interactive Patient Education © 2018 [employer] ° °

## 2017-08-27 NOTE — Transfer of Care (Signed)
Immediate Anesthesia Transfer of Care Note  Patient: Ruth Hughes  Procedure(s) Performed: LAPAROSCOPIC CHOLECYSTECTOMY (N/A )  Patient Location: PACU  Anesthesia Type:General  Level of Consciousness: awake and patient cooperative  Airway & Oxygen Therapy: Patient Spontanous Breathing and Patient connected to face mask oxygen  Post-op Assessment: Report given to RN, Post -op Vital signs reviewed and stable and Patient moving all extremities  Post vital signs: Reviewed and stable  Last Vitals:  Vitals Value Taken Time  BP    Temp    Pulse 83 08/27/2017  8:32 AM  Resp    SpO2 96 % 08/27/2017  8:32 AM  Vitals shown include unvalidated device data.  Last Pain:  Vitals:   08/27/17 0643  PainSc: 0-No pain         Complications: No apparent anesthesia complications

## 2017-08-27 NOTE — Anesthesia Postprocedure Evaluation (Signed)
Anesthesia Post Note Late entry for 0945  Patient: Ruth Hughes  Procedure(s) Performed: LAPAROSCOPIC CHOLECYSTECTOMY (N/A )  Patient location during evaluation: Short Stay Anesthesia Type: General Level of consciousness: awake and alert, oriented and patient cooperative Pain management: pain level controlled Vital Signs Assessment: post-procedure vital signs reviewed and stable Respiratory status: spontaneous breathing Cardiovascular status: stable Postop Assessment: no apparent nausea or vomiting Anesthetic complications: no     Last Vitals:  Vitals:   08/27/17 0915 08/27/17 0924  BP: 112/64 106/61  Pulse: 68 61  Resp: 17 18  Temp:  36.5 C  SpO2: 100% 98%    Last Pain:  Vitals:   08/27/17 0940  TempSrc:   PainSc: 5                  ADAMS, AMY A

## 2017-08-30 ENCOUNTER — Encounter (HOSPITAL_COMMUNITY): Payer: Self-pay | Admitting: General Surgery

## 2017-09-07 ENCOUNTER — Ambulatory Visit (INDEPENDENT_AMBULATORY_CARE_PROVIDER_SITE_OTHER): Payer: Self-pay | Admitting: General Surgery

## 2017-09-07 ENCOUNTER — Encounter: Payer: Self-pay | Admitting: General Surgery

## 2017-09-07 VITALS — BP 124/71 | HR 72 | Temp 98.2°F | Ht 67.0 in | Wt 214.0 lb

## 2017-09-07 DIAGNOSIS — Z09 Encounter for follow-up examination after completed treatment for conditions other than malignant neoplasm: Secondary | ICD-10-CM

## 2017-09-07 NOTE — Progress Notes (Signed)
Subjective:     Ruth Hughes  Status post laparoscopic cholecystectomy.  Doing well.  Preoperative symptoms resolved. Objective:    BP 124/71   Pulse 72   Temp 98.2 F (36.8 C)   Ht 5\' 7"  (1.702 m)   Wt 214 lb (97.1 kg)   BMI 33.52 kg/m   General:  alert, cooperative and no distress  Abdomen soft, incisions healing well.  Staples removed, Steri-Strips applied. Final pathology consistent with diagnosis.     Assessment:    Doing well postoperatively.    Plan:   Increase activity as able.  Follow-up here as needed.

## 2017-10-06 ENCOUNTER — Encounter: Payer: Self-pay | Admitting: "Endocrinology

## 2017-10-06 ENCOUNTER — Ambulatory Visit (INDEPENDENT_AMBULATORY_CARE_PROVIDER_SITE_OTHER): Payer: Medicare Other | Admitting: "Endocrinology

## 2017-10-06 VITALS — BP 120/74 | HR 76 | Ht 66.0 in | Wt 214.0 lb

## 2017-10-06 DIAGNOSIS — E212 Other hyperparathyroidism: Secondary | ICD-10-CM

## 2017-10-06 DIAGNOSIS — M816 Localized osteoporosis [Lequesne]: Secondary | ICD-10-CM | POA: Diagnosis not present

## 2017-10-06 DIAGNOSIS — E213 Hyperparathyroidism, unspecified: Secondary | ICD-10-CM | POA: Insufficient documentation

## 2017-10-06 MED ORDER — ALENDRONATE SODIUM 70 MG PO TABS: 70.0000 mg | ORAL_TABLET | ORAL | 6 refills | Status: DC

## 2017-10-06 NOTE — Progress Notes (Signed)
Consult Note       10/06/2017, 2:35 PM  Ruth Hughes is a 62 y.o.-year-old female, referred by her PCP, Dr. Talbert Cage- talbert, for evaluation for hypercalcemia/hyperparathyroidism.   Past Medical History:  Diagnosis Date  . Anxiety   . Cancer [facility]) 2005   polyps  . Degenerative joint disease    s/p right THA; left THA planned for 03/2012  . Depression   . GERD (gastroesophageal reflux disease)    Hiatal hernia  . Goiter   . Hx of adenomatous colonic polyps   . Hyperlipidemia    H/o PVCs  . Hypertension   . PONV (postoperative nausea and vomiting)   . Spinal stenosis    CT in 07/2010: L3-4; degenerative joint disease at L4-5    Past Surgical History:  Procedure Laterality Date  . CHOLECYSTECTOMY N/A 08/27/2017   Procedure: LAPAROSCOPIC CHOLECYSTECTOMY;  Surgeon: Aviva Signs, MD;  Location: [facility] ORS;  Service: General;  Laterality: N/A;  . COLONOSCOPY  01/07/2001   hyperplastic polyp/tubulovillous adenoma with severe atypia  . COLONOSCOPY     + ileoscopy: per patient has additional one in 2006, 2007 and Flex sig in 2008. Told next colonoscopy in 2013.   Marland Kitchen COLONOSCOPY  09/03/11   internal hemrrhoids/diverticulosis in the left colon/ TUBULAR ADENOMA of descending colon. random colon bx negative for microscopic colitis. next TCS 08/2016  . COLONOSCOPY N/A 02/25/2017   Dr. Oneida Alar: Three 2-3 mm polyps at splenic flexure, hepatic flexure, and ascending colon, three sessile polyps in distal sigmoid and ascending colon (4-5 mm), few small-mouthed diverticula in rect-sigmoid and sigmoid, external hemorrhoids. 3 year surveillance. (simple adenomas and benign polypoid lesions)  . COLONOSCOPY W/ POLYPECTOMY  12/14/2003   Dr. Patel-->1.5 cm size polyp with patchy whitish mucosa in sigmoid colon around base of polyp. Path-adenomatous polyp  . cubital tunnel repair Bilateral   . ESOPHAGOGASTRODUODENOSCOPY  2005   hiatal hernia, GERD per patient   . ESOPHAGOGASTRODUODENOSCOPY  09/2000   Dr. Betti Cruz, minimal gastritis, bx no hpylori  . ESOPHAGOGASTRODUODENOSCOPY  09/03/11   small bowel bx negative for celiac, mild chronic inactive gastritis, no h.pylori.   . ESOPHAGOGASTRODUODENOSCOPY N/A 08/08/2013   Dr. Oneida Alar: gastritis, small hiatal hernia, multiple small sessile polyps  . KNEE SURGERY  03/04   right  . lipoma removal Right    arm  . POLYPECTOMY  02/25/2017   Procedure: POLYPECTOMY;  Surgeon: Danie Binder, MD;  Location: [facility];  Service: Endoscopy;;  ascending colonx3; hepatic flexure; splenic flexure  . TOTAL HIP ARTHROPLASTY  2001   Right  . TOTAL HIP ARTHROPLASTY  2013   right  . TOTAL HIP ARTHROPLASTY  08/2012   left    Social History   Tobacco Use  . Smoking status: Never Smoker  . Smokeless tobacco: Never Used  . Tobacco comment: Never smoked  Substance Use Topics  . Alcohol use: No    Alcohol/week: 0.0 oz  . Drug use: No    Outpatient Encounter Medications as of 10/06/2017  Medication Sig  . acetaminophen (TYLENOL) 500 MG tablet Take 500 mg by mouth every 6 (six) hours as needed for mild pain or moderate  pain.   . alendronate (FOSAMAX) 70 MG tablet Take 1 tablet (70 mg total) by mouth once a week. Take with a full glass of water on an empty stomach.  Marland Kitchen aspirin 81 MG tablet Take 81 mg by mouth daily.  . cetirizine (ZYRTEC) 10 MG tablet Take 10 mg by mouth daily.  . Cholecalciferol (VITAMIN D3) 2000 UNITS TABS Take 2,000 Units by mouth at bedtime.   . Ferrous Gluconate 324 (37.5 Fe) MG TABS Take 1 tablet by mouth every evening.  Marland Kitchen HYDROcodone-acetaminophen (NORCO) 5-325 MG tablet Take 1 tablet by mouth every 6 (six) hours as needed for moderate pain.  . metoprolol tartrate (LOPRESSOR) 25 MG tablet Take 12.5 mg by mouth 2 (two) times daily.  . Omega-3 Fatty Acids (OMEGA-3 FISH OIL PO) Take 1,000 mg by mouth daily.  . Probiotic Product (ADVANCED PROBIOTIC PO) Take 1 capsule by mouth daily.  .  sertraline (ZOLOFT) 50 MG tablet Take 50 mg by mouth at bedtime.   . simvastatin (ZOCOR) 20 MG tablet Take 20 mg by mouth daily at 6 PM.   . triamcinolone cream (KENALOG) 0.1 % Apply 1 application topically 2 (two) times daily as needed (for rash).  . vitamin C (ASCORBIC ACID) 500 MG tablet Take 500 mg by mouth every evening. With iron   No facility-administered encounter medications on file as of 10/06/2017.     Allergies  Allergen Reactions  . Bextra [Valdecoxib] Nausea Only  . Tramadol Nausea Only     HPI  YASLYN CUMBY was diagnosed with hypercalcemia prior to her 58th birthday, however did not require any intervention. I reviewed patient's pertinent labs: Has calcium in her records was 11.1.  Most recently it was 10.7 associated with high PTH of 70 (normal 14-64).  Lab Results  Component Value Date   CALCIUM 10.7 (H) 08/23/2017   CALCIUM 10.6 (H) 08/28/2011   CALCIUM 11.1 07/11/2011    I reviewed pt's DEXA scans:  Date L1-L4 T score FN T score 33% distal Radius  Oct 04, 2017 -1.7  -3.6       -Her recent bone density reveals osteoporosis of the distal one third of her forearm. No prior history of fragility fractures or falls. No history of  kidney stones.  No history of CKD. Last BUN/Cr: Lab Results  Component Value Date   BUN 20 08/23/2017   CREATININE 0.81 08/23/2017    she is not on HCTZ or other thiazide therapy.  She has history of vitamin D deficiency currently on vitamin D supplements.   she is not on calcium supplements,  she eats dairy and green, leafy, vegetables on average amounts.  she not a good historian, however reports a family history of hypercalcemia requiring neck surgery in 1 of her brothers in his 31s.   -Denies family history of pituitary, thyroid, pancreatic dysfunctions. 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   ROS:  Constitutional: no weight  gain/loss, no fatigue, no subjective hyperthermia, no subjective hypothermia Eyes: no blurry vision, no xerophthalmia ENT: no sore throat, no nodules palpated in throat, no dysphagia/odynophagia, no hoarseness Cardiovascular: no Chest Pain, no Shortness of Breath, no palpitations, no leg swelling Respiratory: no cough, no SOB Gastrointestinal: no Nausea/Vomiting/Diarhhea Musculoskeletal: no muscle/joint aches, + gait abnormality. Skin: no rashes Neurological: no tremors, no numbness, no tingling, no dizziness Psychiatric: no depression, no anxiety  PE: BP 120/74   Pulse  76   Ht 5\' 6"  (1.676 m)   Wt 214 lb (97.1 kg)   BMI 34.54 kg/m  Wt Readings from Last 3 Encounters:  10/06/17 214 lb (97.1 kg)  09/07/17 214 lb (97.1 kg)  08/23/17 215 lb (97.5 kg)   Constitutional: + Obese for height, not in acute distress, normal state of mind Eyes: PERRLA, EOMI, no exophthalmos ENT: moist mucous membranes, no thyromegaly, no cervical lymphadenopathy Cardiovascular: normal precordial activity, Regular Rate and Rhythm, no Murmur/Rubs/Gallops Respiratory:  adequate breathing efforts, no gross chest deformity, Clear to auscultation bilaterally Gastrointestinal: abdomen soft, Non -tender, No distension, Bowel Sounds present Musculoskeletal: no gross deformities, strength intact in all four extremities Skin: moist, warm, no rashes Neurological: no tremor with outstretched hands, Deep tendon reflexes normal in all four extremities.   CMP ( most recent) CMP     Component Value Date/Time   NA 139 08/23/2017 1506   NA 143 07/11/2011 0748   K 3.7 08/23/2017 1506   K 4.1 07/11/2011 0748   CL 107 08/23/2017 1506   CO2 22 08/23/2017 1506   GLUCOSE 82 08/23/2017 1506   BUN 20 08/23/2017 1506   BUN 17 05/16/2013   CREATININE 0.81 08/23/2017 1506   CREATININE 0.65 05/16/2013   CALCIUM 10.7 (H) 08/23/2017 1506   CALCIUM 11.1 07/11/2011 0748   PROT 7.0 08/23/2017 1506   ALBUMIN 3.9 08/23/2017 1506    ALBUMIN 4.6 07/11/2011 0748   AST 16 08/23/2017 1506   AST 20 07/11/2011 0748   ALT 12 (L) 08/23/2017 1506   ALKPHOS 74 08/23/2017 1506   ALKPHOS 89 07/11/2011 0748   BILITOT 1.0 08/23/2017 1506   BILITOT 1.4 07/11/2011 0748   GFRNONAA >60 08/23/2017 1506   GFRAA >60 08/23/2017 1506     Lab Results  Component Value Date   TSH 1.69 07/11/2011     Assessment: 1. Hypercalcemia / Hyperparathyroidism  Plan: Patient has had several instances of elevated calcium, with the highest level being at 11.1 mg/dl in 2013.  More recently her calcium was 10.7 mg/dL with a corresponding elevated PTH of 70.   -She has recent bone density which shows osteoporosis mainly on distal one third of her forearm. -She likely has primary hyperparathyroidism with apparent comp occasion of hypercalcemia, osteoporosis.    No history of fragility fractures. No abdominal pain, no major mood disorders, no bone pain.  - I discussed with the patient about the physiology of calcium and parathyroid hormone, and possible  effects of  increased PTH/ Calcium , including kidney stones, cardiac dysrhythmias, osteoporosis, abdominal pain, etc.   -She will need 1 more confirmatory work-up with repeat PTH/calcium, magnesium, phosphorus; and 24-hour urine calcium and creatinine measurement to rule out   the rare but important cause of mild elevation in calcium and PTH- FHH ( Familial Hypocalciuric Hypercalcemia), which may not require any active intervention.  -If her studies confirm primary hyperparathyroidism, she is a surgical candidate. -For the osteoporosis mainly affecting her forearm, I discussed and initiated treatment with alendronate 70 mg p.o. Weekly. -She will return in 2 weeks with her lab results to discuss and decide on treatment options.  - Time spent with the patient: 45 minutes, of which >50% was spent in obtaining information about her symptoms, reviewing her previous labs, evaluations, and treatments,  counseling her about her hypercalcemia/hyperparathyroidism, osteoporosis, and developing a plan to confirm the diagnosis and long term treatment as necessary.  Haskel Schroeder participated in the discussions, expressed understanding, and voiced agreement  with the above plans.  All questions were answered to her satisfaction. she is encouraged to contact clinic should she have any questions or concerns prior to her return visit.  - Return in about 2 weeks (around 10/20/2017) for labs today, 24 Hours Urine Calcium.   Glade Lloyd, MD [facility] [address] [place], [place] [address] Phone: [phone]  Fax: [phone]    This note was partially dictated with voice recognition software. Similar sounding words can be transcribed inadequately or may not  be corrected upon review.  10/06/2017, 2:35 PM

## 2017-10-12 LAB — PHOSPHORUS: PHOSPHORUS: 2.6 mg/dL (ref 2.5–4.5)

## 2017-10-12 LAB — CALCIUM, URINE, 24 HOUR: Calcium, 24H Urine: 11 mg/24 h — ABNORMAL LOW

## 2017-10-12 LAB — MAGNESIUM: MAGNESIUM: 2.1 mg/dL (ref 1.5–2.5)

## 2017-10-12 LAB — PTH, INTACT AND CALCIUM
Calcium: 10.3 mg/dL (ref 8.6–10.4)
PTH: 86 pg/mL — AB (ref 14–64)

## 2017-10-12 LAB — CREATININE, URINE, 24 HOUR: Creatinine, 24H Ur: 0.63 g/(24.h) (ref 0.50–2.15)

## 2017-10-14 ENCOUNTER — Ambulatory Visit (INDEPENDENT_AMBULATORY_CARE_PROVIDER_SITE_OTHER): Payer: Medicare Other | Admitting: Gastroenterology

## 2017-10-14 ENCOUNTER — Encounter: Payer: Self-pay | Admitting: Gastroenterology

## 2017-10-14 VITALS — BP 126/76 | HR 62 | Temp 97.0°F | Ht 66.0 in | Wt 215.6 lb

## 2017-10-14 DIAGNOSIS — R1011 Right upper quadrant pain: Secondary | ICD-10-CM

## 2017-10-14 NOTE — Assessment & Plan Note (Signed)
Doing well s/p cholecystectomy. No further abdominal pain. Doing well overall from a GI standpoint. Next colonoscopy 2021. Return in 6 months, and if she is doing well then, return in 1 year.

## 2017-10-14 NOTE — Progress Notes (Signed)
CC'ED TO PCP 

## 2017-10-14 NOTE — Patient Instructions (Signed)
I am glad you are doing well!  You do not have to take the Protonix every day. However, if you start having reflux symptoms, indigestion, heartburn, etc, then please start taking it again.  I will see you in 6 months! It was so good to see you again! Please call if you need anything, and have a wonderful summer.  It was a pleasure to see you today. I strive to create trusting relationships with patients to provide genuine, compassionate, and quality care. I value your feedback. If you receive a survey regarding your visit,  I greatly appreciate you taking time to fill this out.   Annitta Needs, PhD, ANP-BC [facility]

## 2017-10-14 NOTE — Progress Notes (Signed)
Primary Care Physician:  Zhou-Talbert, Elwyn Lade, MD  Primary GI: Dr. Oneida Alar   Chief Complaint  Patient presents with  . Abdominal Pain    RUQ has improved    HPI:   Ruth Hughes is a 62 y.o. female presenting today with a history of GERD, gastritis, adenomas. Recently completed colonoscopy in Sept 2018 with multiple adenomas, and she will be due for surveillance in 2021.  Due to RUQ pain, early satiety, updated HIDA, which revealing abnormally low EF at 20% and was symptomatic during test. Underwent lap chole March 2019. Here for follow-up. She is doing well now. Abdominal pain resolved. No nausea or vomiting. Purposefully limiting oral intake and attempting to eat healthy. Walking in the neighborhood. Denies any GERD symptoms. Not on a PPI.   Past Medical History:  Diagnosis Date  . Anxiety   . Cancer [facility]) 2005   polyps  . Degenerative joint disease    s/p right THA; left THA planned for 03/2012  . Depression   . GERD (gastroesophageal reflux disease)    Hiatal hernia  . Goiter   . Hx of adenomatous colonic polyps   . Hyperlipidemia    H/o PVCs  . Hypertension   . PONV (postoperative nausea and vomiting)   . Spinal stenosis    CT in 07/2010: L3-4; degenerative joint disease at L4-5    Past Surgical History:  Procedure Laterality Date  . CHOLECYSTECTOMY N/A 08/27/2017   Procedure: LAPAROSCOPIC CHOLECYSTECTOMY;  Surgeon: Aviva Signs, MD;  Location: [facility];  Service: General;  Laterality: N/A;  . COLONOSCOPY  01/07/2001   hyperplastic polyp/tubulovillous adenoma with severe atypia  . COLONOSCOPY     + ileoscopy: per patient has additional one in 2006, 2007 and Flex sig in 2008. Told next colonoscopy in 2013.   Marland Kitchen COLONOSCOPY  09/03/11   internal hemrrhoids/diverticulosis in the left colon/ TUBULAR ADENOMA of descending colon. random colon bx negative for microscopic colitis. next TCS 08/2016  . COLONOSCOPY N/A 02/25/2017   Dr. Oneida Alar: Three 2-3 mm polyps at splenic  flexure, hepatic flexure, and ascending colon, three sessile polyps in distal sigmoid and ascending colon (4-5 mm), few small-mouthed diverticula in rect-sigmoid and sigmoid, external hemorrhoids. 3 year surveillance. (simple adenomas and benign polypoid lesions)  . COLONOSCOPY W/ POLYPECTOMY  12/14/2003   Dr. Patel-->1.5 cm size polyp with patchy whitish mucosa in sigmoid colon around base of polyp. Path-adenomatous polyp  . cubital tunnel repair Bilateral   . ESOPHAGOGASTRODUODENOSCOPY  2005   hiatal hernia, GERD per patient  . ESOPHAGOGASTRODUODENOSCOPY  09/2000   Dr. Betti Cruz, minimal gastritis, bx no hpylori  . ESOPHAGOGASTRODUODENOSCOPY  09/03/11   small bowel bx negative for celiac, mild chronic inactive gastritis, no h.pylori.   . ESOPHAGOGASTRODUODENOSCOPY N/A 08/08/2013   Dr. Oneida Alar: gastritis, small hiatal hernia, multiple small sessile polyps  . KNEE SURGERY  03/04   right  . lipoma removal Right    arm  . POLYPECTOMY  02/25/2017   Procedure: POLYPECTOMY;  Surgeon: Danie Binder, MD;  Location: [facility];  Service: Endoscopy;;  ascending colonx3; hepatic flexure; splenic flexure  . TOTAL HIP ARTHROPLASTY  2001   Right  . TOTAL HIP ARTHROPLASTY  2013   right  . TOTAL HIP ARTHROPLASTY  08/2012   left    Current Outpatient Medications  Medication Sig Dispense Refill  . acetaminophen (TYLENOL) 500 MG tablet Take 500 mg by mouth every 6 (six) hours as needed for mild pain or moderate pain.     Marland Kitchen  alendronate (FOSAMAX) 70 MG tablet Take 1 tablet (70 mg total) by mouth once a week. Take with a full glass of water on an empty stomach. 4 tablet 6  . aspirin 81 MG tablet Take 81 mg by mouth daily.    . cetirizine (ZYRTEC) 10 MG tablet Take 10 mg by mouth daily.    . Cholecalciferol (VITAMIN D3) 2000 UNITS TABS Take 2,000 Units by mouth at bedtime.     . Ferrous Gluconate 324 (37.5 Fe) MG TABS Take 1 tablet by mouth every evening.    Marland Kitchen HYDROcodone-acetaminophen (NORCO) 5-325 MG  tablet Take 1 tablet by mouth every 6 (six) hours as needed for moderate pain. 25 tablet 0  . metoprolol tartrate (LOPRESSOR) 25 MG tablet Take 12.5 mg by mouth 2 (two) times daily.    . Omega-3 Fatty Acids (OMEGA-3 FISH OIL PO) Take 1,000 mg by mouth daily.    . Probiotic Product (ADVANCED PROBIOTIC PO) Take 1 capsule by mouth daily.    . sertraline (ZOLOFT) 50 MG tablet Take 50 mg by mouth at bedtime.     . simvastatin (ZOCOR) 20 MG tablet Take 20 mg by mouth daily at 6 PM.     . triamcinolone cream (KENALOG) 0.1 % Apply 1 application topically 2 (two) times daily as needed (for rash).    . vitamin C (ASCORBIC ACID) 500 MG tablet Take 500 mg by mouth every evening. With iron     No current facility-administered medications for this visit.     Allergies as of 10/14/2017 - Review Complete 10/14/2017  Allergen Reaction Noted  . Bextra [valdecoxib] Nausea Only 08/24/2011  . Tramadol Nausea Only 08/24/2011    Family History  Problem Relation Age of Onset  . Hypertension Mother   . Hyperlipidemia Mother   . Stroke Mother        CAD  . Diabetes Mother   . Hypertension Father        CAD  . Diabetes Father   . Diabetes Sister   . Hypertension Brother   . Hyperlipidemia Brother   . Colon cancer Neg Hx   . Breast cancer Neg Hx     Social History   Socioeconomic History  . Marital status: Single    Spouse name: Not on file  . Number of children: 1  . Years of education: Not on file  . Highest education level: Not on file  Occupational History  . 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 Occupation: cna    Comment: retired  . Occupation: daycare    Comment: retired  Scientific laboratory technician  . Financial resource strain: Not on file  . Food insecurity:    Worry: Not on file    Inability: Not on file  . Transportation needs:    Medical: Not on file    Non-medical: Not on file  Tobacco Use  . Smoking status: Never Smoker  . Smokeless tobacco: Never Used  .  Tobacco comment: Never smoked  Substance and Sexual Activity  . Alcohol use: No    Alcohol/week: 0.0 oz  . Drug use: No  . Sexual activity: Not Currently    Birth control/protection: Post-menopausal  Lifestyle  . Physical activity:    Days per week: Not on file    Minutes per session: Not on file  . Stress: Not on file  Relationships  . Social connections:    Talks on phone: Not on file    Gets together:  Not on file    Attends religious service: Not on file    Active member of club or organization: Not on file    Attends meetings of clubs or organizations: Not on file    Relationship status: Not on file  Other Topics Concern  . Not on file  Social History Narrative  . Not on file    Review of Systems: Gen: Denies fever, chills, anorexia. Denies fatigue, weakness, weight loss.  CV: Denies chest pain, palpitations, syncope, peripheral edema, and claudication. Resp: Denies dyspnea at rest, cough, wheezing, coughing up blood, and pleurisy. GI: see HPI  Derm: Denies rash, itching, dry skin Psych: Denies depression, anxiety, memory loss, confusion. No homicidal or suicidal ideation.  Heme: Denies bruising, bleeding, and enlarged lymph nodes.  Physical Exam: BP 126/76   Pulse 62   Temp (!) 97 F (36.1 C) (Oral)   Ht 5\' 6"  (1.676 m)   Wt 215 lb 9.6 oz (97.8 kg)   BMI 34.80 kg/m  General:   Alert and oriented. No distress noted. Pleasant and cooperative.  Head:  Normocephalic and atraumatic. Eyes:  Conjuctiva clear without scleral icterus. Mouth:  Oral mucosa pink and moist.  Abdomen:  +BS, soft, non-tender and non-distended. No rebound or guarding. No HSM or masses noted. Msk:  Symmetrical without gross deformities. Normal posture. Extremities:  Without edema. Neurologic:  Alert and  oriented x4 Psych:  Alert and cooperative. Normal mood and affect.

## 2017-10-27 ENCOUNTER — Ambulatory Visit (INDEPENDENT_AMBULATORY_CARE_PROVIDER_SITE_OTHER): Payer: Medicare Other | Admitting: "Endocrinology

## 2017-10-27 ENCOUNTER — Encounter: Payer: Self-pay | Admitting: "Endocrinology

## 2017-10-27 VITALS — BP 118/79 | HR 58 | Ht 66.0 in | Wt 214.0 lb

## 2017-10-27 DIAGNOSIS — M816 Localized osteoporosis [Lequesne]: Secondary | ICD-10-CM | POA: Diagnosis not present

## 2017-10-27 DIAGNOSIS — E212 Other hyperparathyroidism: Secondary | ICD-10-CM | POA: Diagnosis not present

## 2017-10-27 NOTE — Progress Notes (Signed)
Endocrinology follow-up note       10/27/2017, 7:00 PM  Ruth Hughes is a 62 y.o.-year-old female, referred by her PCP, Dr. Talbert Cage- talbert, for evaluation for hypercalcemia/hyperparathyroidism.   Past Medical History:  Diagnosis Date  . Anxiety   . Cancer [facility]) 2005   polyps  . Degenerative joint disease    s/p right THA; left THA planned for 03/2012  . Depression   . GERD (gastroesophageal reflux disease)    Hiatal hernia  . Goiter   . Hx of adenomatous colonic polyps   . Hyperlipidemia    H/o PVCs  . Hypertension   . PONV (postoperative nausea and vomiting)   . Spinal stenosis    CT in 07/2010: L3-4; degenerative joint disease at L4-5    Past Surgical History:  Procedure Laterality Date  . CHOLECYSTECTOMY N/A 08/27/2017   Procedure: LAPAROSCOPIC CHOLECYSTECTOMY;  Surgeon: Aviva Signs, MD;  Location: [facility];  Service: General;  Laterality: N/A;  . COLONOSCOPY  01/07/2001   hyperplastic polyp/tubulovillous adenoma with severe atypia  . COLONOSCOPY     + ileoscopy: per patient has additional one in 2006, 2007 and Flex sig in 2008. Told next colonoscopy in 2013.   Marland Kitchen COLONOSCOPY  09/03/11   internal hemrrhoids/diverticulosis in the left colon/ TUBULAR ADENOMA of descending colon. random colon bx negative for microscopic colitis. next TCS 08/2016  . COLONOSCOPY N/A 02/25/2017   Dr. Oneida Alar: Three 2-3 mm polyps at splenic flexure, hepatic flexure, and ascending colon, three sessile polyps in distal sigmoid and ascending colon (4-5 mm), few small-mouthed diverticula in rect-sigmoid and sigmoid, external hemorrhoids. 3 year surveillance. (simple adenomas and benign polypoid lesions)  . COLONOSCOPY W/ POLYPECTOMY  12/14/2003   Dr. Patel-->1.5 cm size polyp with patchy whitish mucosa in sigmoid colon around base of polyp. Path-adenomatous polyp  . cubital tunnel repair Bilateral   . ESOPHAGOGASTRODUODENOSCOPY  2005   hiatal hernia, GERD  per patient  . ESOPHAGOGASTRODUODENOSCOPY  09/2000   Dr. Betti Cruz, minimal gastritis, bx no hpylori  . ESOPHAGOGASTRODUODENOSCOPY  09/03/11   small bowel bx negative for celiac, mild chronic inactive gastritis, no h.pylori.   . ESOPHAGOGASTRODUODENOSCOPY N/A 08/08/2013   Dr. Oneida Alar: gastritis, small hiatal hernia, multiple small sessile polyps  . KNEE SURGERY  03/04   right  . lipoma removal Right    arm  . POLYPECTOMY  02/25/2017   Procedure: POLYPECTOMY;  Surgeon: Danie Binder, MD;  Location[facility];  Service: Endoscopy;;  ascending colonx3; hepatic flexure; splenic flexure  . TOTAL HIP ARTHROPLASTY  2001   Right  . TOTAL HIP ARTHROPLASTY  2013   right  . TOTAL HIP ARTHROPLASTY  08/2012   left    Social History   Tobacco Use  . Smoking status: Never Smoker  . Smokeless tobacco: Never Used  . Tobacco comment: Never smoked  Substance Use Topics  . Alcohol use: No    Alcohol/week: 0.0 oz  . Drug use: No    Outpatient Encounter Medications as of 10/27/2017  Medication Sig  . acetaminophen (TYLENOL) 500 MG tablet Take 500 mg by mouth every 6 (six) hours as needed for mild pain or moderate pain.   Marland Kitchen alendronate (FOSAMAX)  70 MG tablet Take 1 tablet (70 mg total) by mouth once a week. Take with a full glass of water on an empty stomach.  Marland Kitchen aspirin 81 MG tablet Take 81 mg by mouth daily.  . cetirizine (ZYRTEC) 10 MG tablet Take 10 mg by mouth daily.  . Cholecalciferol (VITAMIN D3) 2000 UNITS TABS Take 2,000 Units by mouth at bedtime.   . Ferrous Gluconate 324 (37.5 Fe) MG TABS Take 1 tablet by mouth every evening.  Marland Kitchen HYDROcodone-acetaminophen (NORCO) 5-325 MG tablet Take 1 tablet by mouth every 6 (six) hours as needed for moderate pain.  . metoprolol tartrate (LOPRESSOR) 25 MG tablet Take 12.5 mg by mouth 2 (two) times daily.  . Omega-3 Fatty Acids (OMEGA-3 FISH OIL PO) Take 1,000 mg by mouth daily.  . Probiotic Product (ADVANCED PROBIOTIC PO) Take 1 capsule by mouth daily.   . sertraline (ZOLOFT) 50 MG tablet Take 50 mg by mouth at bedtime.   . simvastatin (ZOCOR) 20 MG tablet Take 20 mg by mouth daily at 6 PM.   . triamcinolone cream (KENALOG) 0.1 % Apply 1 application topically 2 (two) times daily as needed (for rash).  . vitamin C (ASCORBIC ACID) 500 MG tablet Take 500 mg by mouth every evening. With iron   No facility-administered encounter medications on file as of 10/27/2017.     Allergies  Allergen Reactions  . Bextra [Valdecoxib] Nausea Only  . Tramadol Nausea Only     HPI  Ruth Hughes was diagnosed with hypercalcemia prior to her 68th birthday, however did not require any intervention.  -She was sent for confirmatory work-up to rule out primary hyperparathyroidism.   -Has no new complaints.     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 No prior history of fragility fractures or falls. No history of  kidney stones.  No history of CKD. Last BUN/Cr: Lab Results  Component Value Date   BUN 20 08/23/2017   CREATININE 0.81 08/23/2017    she is not on HCTZ or other thiazide therapy.  She has history of vitamin D deficiency currently on vitamin D supplements.   she is not on calcium supplements,  she eats dairy and green, leafy, vegetables on average amounts.  she not a good historian, however reports a family history of hypercalcemia requiring neck surgery in 1 of her brothers in his 46s.   -Denies family history of pituitary, thyroid, pancreatic dysfunctions. I reviewed her chart and she also has a history of diffuse osteoarthritis which required bilateral  hip replacement in 2001 and  2014.   ROS:  Constitutional: + steady weight gain/loss, no fatigue, no subjective hyperthermia, no subjective hypothermia Eyes: no blurry vision, no xerophthalmia ENT: no sore throat, no nodules palpated in throat, no dysphagia/odynophagia, no hoarseness Cardiovascular: no Chest Pain, no Shortness of Breath, no palpitations, no leg swelling Respiratory: no cough, no SOB Gastrointestinal: no Nausea/Vomiting/Diarhhea Musculoskeletal: no muscle/joint aches, + gait abnormality. Skin: no rashes Neurological: no tremors, no numbness, no tingling, no dizziness Psychiatric: no depression, no anxiety  PE: BP 118/79  Pulse (!) 58   Ht 5' 6"  (1.676 m)   Wt 214 lb (97.1 kg)   BMI 34.54 kg/m  Wt Readings from Last 3 Encounters:  10/27/17 214 lb (97.1 kg)  10/14/17 215 lb 9.6 oz (97.8 kg)  10/06/17 214 lb (97.1 kg)   Constitutional: + Obese for height, not in acute distress, normal state of mind Eyes: PERRLA, EOMI, no exophthalmos ENT: moist mucous membranes, no thyromegaly, no cervical lymphadenopathy Musculoskeletal: no gross deformities, strength intact in all four extremities Skin: moist, warm, no rashes Neurological: no tremor with outstretched hands, Deep tendon reflexes normal in all four extremities.   CMP ( most recent) CMP   Lab Results  Component Value Date   TSH 1.69 07/11/2011    Recent Results (from the past 2160 hour(s))  CBC with Differential/Platelet     Status: Abnormal   Collection Time: 08/23/17  3:06 PM  Result Value Ref Range   WBC 8.3 4.0 - 10.5 K/uL   RBC 5.26 (H) 3.87 - 5.11 MIL/uL   Hemoglobin 12.3 12.0 - 15.0 g/dL   HCT 36.1 36.0 - 46.0 %   MCV 68.6 (L) 78.0 - 100.0 fL   MCH 23.4 (L) 26.0 - 34.0 pg   MCHC 34.1 30.0 - 36.0 g/dL   RDW 21.7 (H) 11.5 - 15.5 %   Platelets 354 150 - 400 K/uL    Comment: PLATELET COUNT CONFIRMED BY SMEAR SPECIMEN CHECKED FOR CLOTS    Neutrophils Relative % 52 %   Neutro Abs 4.3 1.7 - 7.7 K/uL   Lymphocytes Relative  39 %   Lymphs Abs 3.3 0.7 - 4.0 K/uL   Monocytes Relative 7 %   Monocytes Absolute 0.6 0.1 - 1.0 K/uL   Eosinophils Relative 1 %   Eosinophils Absolute 0.1 0.0 - 0.7 K/uL   Basophils Relative 1 %   Basophils Absolute 0.1 0.0 - 0.1 K/uL   RBC Morphology ANISOCYTES     Comment: MICROCYTES Performed at [facility], [address] [place], [place] [address]   Comprehensive metabolic panel     Status: Abnormal   Collection Time: 08/23/17  3:06 PM  Result Value Ref Range   Sodium 139 135 - 145 mmol/L   Potassium 3.7 3.5 - 5.1 mmol/L   Chloride 107 101 - 111 mmol/L   CO2 22 22 - 32 mmol/L   Glucose, Bld 82 65 - 99 mg/dL   BUN 20 6 - 20 mg/dL   Creatinine, Ser 0.81 0.44 - 1.00 mg/dL   Calcium 10.7 (H) 8.9 - 10.3 mg/dL   Total Protein 7.0 6.5 - 8.1 g/dL   Albumin 3.9 3.5 - 5.0 g/dL   AST 16 15 - 41 U/L   ALT 12 (L) 14 - 54 U/L   Alkaline Phosphatase 74 38 - 126 U/L   Total Bilirubin 1.0 0.3 - 1.2 mg/dL   GFR calc non Af Amer >60 >60 mL/min   GFR calc Af Amer >60 >60 mL/min    Comment: (NOTE) The eGFR has been calculated using the CKD EPI equation. This calculation has not been validated in all clinical situations. eGFR's persistently <60 mL/min signify possible Chronic Kidney Disease.    Anion gap 10 5 - 15    Comment: Performed at [facility], [address] [place], [place] [address]  PTH, intact and calcium     Status: Abnormal   Collection Time: 10/11/17  8:03 AM  Result Value Ref Range   PTH 86 (H) 14 -  64 pg/mL    Comment: . Interpretive Guide    Intact PTH           Calcium ------------------    ----------           ------- Normal Parathyroid    Normal               Normal Hypoparathyroidism    Low or Low Normal    Low Hyperparathyroidism    Primary            Normal or High       High    Secondary          High                 Normal or Low    Tertiary           High                 High Non-Parathyroid    Hypercalcemia      Low or Low Normal    High .     Calcium 10.3 8.6 - 10.4 mg/dL  Magnesium     Status: None   Collection Time: 10/11/17  8:03 AM  Result Value Ref Range   Magnesium 2.1 1.5 - 2.5 mg/dL  Phosphorus     Status: None   Collection Time: 10/11/17  8:03 AM  Result Value Ref Range   Phosphorus 2.6 2.5 - 4.5 mg/dL  Creatinine, urine, 24 hour     Status: None   Collection Time: 10/11/17  8:03 AM  Result Value Ref Range   Creatinine, 24H Ur 0.63 0.50 - 2.15 g/24 h  Calcium, urine, 24 hour     Status: Abnormal   Collection Time: 10/11/17  8:03 AM  Result Value Ref Range   Calcium, 24H Urine 11 (L) mg/24 h    Comment:                           Reference Range  35-250                            Low calcium diet 35-200     Assessment: 1. Hypercalcemia / Hyperparathyroidism  Plan: -Her previsit labs indicate improved calcium level at 10.7, associated with elevated PTH of 86. Patient has had several instances of elevated calcium, with the highest level being at 11.1 mg/dl in 2013.   -She has recent bone density which shows osteoporosis mainly on distal one third of her forearm-currently on alendronate 70 mg p.o. weekly and patient tolerating. -More importantly, 24-hour urine calcium is only 11.  This is against a diagnosis of primary hyperparathyroidism, may indicate a component of [place]. -She gave a history consistent with neck surgery for calcium problem in 1 of her brothers in his 94s. -She is not a surgical candidate at this time.  No history of fragility fractures. No abdominal pain, no major mood disorders, no bone pain.  - I discussed with the patient about the physiology of calcium and parathyroid hormone, and possible  effects of  increased PTH/ Calcium , including kidney stones, cardiac dysrhythmias, osteoporosis, abdominal pain, etc.   -For the osteoporosis mainly affecting her forearm, I discussed and initiated treatment with alendronate 70 mg p.o. Weekly. -She will be put on observation status. -She will return in 6  months with repeat PTH/calcium.  She is advised to remain  only on cholecalciferol 2000 units daily, avoid any calcium supplements.  Haskel Schroeder participated in the discussions, expressed understanding, and voiced agreement with the above plans.  All questions were answered to her satisfaction. she is encouraged to contact clinic should she have any questions or concerns prior to her return visit.  - Return in about 6 months (around 04/29/2018) for follow up with pre-visit labs.   Glade Lloyd, MD [facility] [address] [place], [place] [address] Phone: [phone]  Fax: [phone]    This note was partially dictated with voice recognition software. Similar sounding words can be transcribed inadequately or may not  be corrected upon review.  10/27/2017, 7:00 PM

## 2018-02-28 ENCOUNTER — Telehealth: Payer: Self-pay | Admitting: "Endocrinology

## 2018-02-28 NOTE — Telephone Encounter (Signed)
Eutha is calling stating that she has quit taking alendronate (FOSAMAX) 70 MG tablet due to her mouth and gums are sore, she is asking for an alternative, please advise?

## 2018-02-28 NOTE — Telephone Encounter (Signed)
She can stay off of it until her next visit.

## 2018-03-01 NOTE — Telephone Encounter (Signed)
Left message on machine for new recommendations

## 2018-04-08 ENCOUNTER — Ambulatory Visit (INDEPENDENT_AMBULATORY_CARE_PROVIDER_SITE_OTHER): Payer: Medicare Other | Admitting: Gastroenterology

## 2018-04-08 ENCOUNTER — Encounter: Payer: Self-pay | Admitting: Gastroenterology

## 2018-04-08 VITALS — BP 126/79 | HR 53 | Temp 97.0°F | Ht 66.0 in | Wt 208.2 lb

## 2018-04-08 DIAGNOSIS — R1010 Upper abdominal pain, unspecified: Secondary | ICD-10-CM | POA: Diagnosis not present

## 2018-04-08 NOTE — Patient Instructions (Signed)
I recommend taking Benefiber or Metamucil as directed on the bottle.  Let me know how this works for you!  I will see you back in 6 months or sooner!  I enjoyed seeing you again today! As you know, I value our relationship and want to provide genuine, compassionate, and quality care. I welcome your feedback. If you receive a survey regarding your visit,  I greatly appreciate you taking time to fill this out. See you next time!  Annitta Needs, PhD, ANP-BC [facility]

## 2018-04-08 NOTE — Progress Notes (Signed)
Referring Provider: Alfonse Flavors Primary Care Physician:  Alfonse Flavors, MD Primary GI: Dr. Oneida Alar   Chief Complaint  Patient presents with  . Abdominal Pain    slight pain on right side    HPI:   Ruth Hughes is a 62 y.o. female presenting today with a history of GERD, gastritis, adenomas. Recently completed colonoscopy in Sept 2018 with multiple adenomas, and she will be due for surveillance in 2021. Due to RUQ pain, early satiety, updated HIDA, which revealing abnormally low EF at 20% and was symptomatic during test. Underwent lap chole March 2019.  Here for routine follow-up. She notes a pain in right side/epigastric area not related to eating. Described as a "2 or 3" but not bothersome. If touches it, she gets sore. Better after BM. No straining. Has BM every other day. Soft. Sometimes feels incomplete. No extra fiber in diet. Will have looser stool if eating out in public, with some fecal urgency. Will eat bland.   She is purposefully trying to lose weight. Continues to do well with this.   Past Medical History:  Diagnosis Date  . Anxiety   . Cancer [facility]) 2005   polyps  . Degenerative joint disease    s/p right THA; left THA planned for 03/2012  . Depression   . GERD (gastroesophageal reflux disease)    Hiatal hernia  . Goiter   . Hx of adenomatous colonic polyps   . Hyperlipidemia    H/o PVCs  . Hypertension   . PONV (postoperative nausea and vomiting)   . Spinal stenosis    CT in 07/2010: L3-4; degenerative joint disease at L4-5    Past Surgical History:  Procedure Laterality Date  . CHOLECYSTECTOMY N/A 08/27/2017   Procedure: LAPAROSCOPIC CHOLECYSTECTOMY;  Surgeon: Aviva Signs, MD;  Location: [facility] OR[facility];  Service: General;  Laterality: N/A;  . COLONOSCOPY  01/07/2001   hyperplastic polyp/tubulovillous adenoma with severe atypia  . COLONOSCOPY     + ileoscopy: per patient has additional one in 2006, 2007 and Flex sig in 2008. Told next  colonoscopy in 2013.   Marland Kitchen COLONOSCOPY  09/03/11   internal hemrrhoids/diverticulosis in the left colon/ TUBULAR ADENOMA of descending colon. random colon bx negative for microscopic colitis. next TCS 08/2016  . COLONOSCOPY N/A 02/25/2017   Dr. Oneida Alar: Three 2-3 mm polyps at splenic flexure, hepatic flexure, and ascending colon, three sessile polyps in distal sigmoid and ascending colon (4-5 mm), few small-mouthed diverticula in rect-sigmoid and sigmoid, external hemorrhoids. 3 year surveillance. (simple adenomas and benign polypoid lesions)  . COLONOSCOPY W/ POLYPECTOMY  12/14/2003   Dr. Patel-->1.5 cm size polyp with patchy whitish mucosa in sigmoid colon around base of polyp. Path-adenomatous polyp  . cubital tunnel repair Bilateral   . ESOPHAGOGASTRODUODENOSCOPY  2005   hiatal hernia, GERD per patient  . ESOPHAGOGASTRODUODENOSCOPY  09/2000   Dr. Betti Cruz, minimal gastritis, bx no hpylori  . ESOPHAGOGASTRODUODENOSCOPY  09/03/11   small bowel bx negative for celiac, mild chronic inactive gastritis, no h.pylori.   . ESOPHAGOGASTRODUODENOSCOPY N/A 08/08/2013   Dr. Oneida Alar: gastritis, small hiatal hernia, multiple small sessile polyps  . KNEE SURGERY  03/04   right  . lipoma removal Right    arm  . POLYPECTOMY  02/25/2017   Procedure: POLYPECTOMY;  Surgeon: Danie Binder, MD;  Location: [facility];  Service: Endoscopy;;  ascending colonx3; hepatic flexure; splenic flexure  . TOTAL HIP ARTHROPLASTY  2001   Right  . TOTAL HIP  ARTHROPLASTY  2013   right  . TOTAL HIP ARTHROPLASTY  08/2012   left    Current Outpatient Medications  Medication Sig Dispense Refill  . acetaminophen (TYLENOL) 500 MG tablet Take 500 mg by mouth every 6 (six) hours as needed for mild pain or moderate pain.     Marland Kitchen aspirin 81 MG tablet Take 81 mg by mouth daily.    . cetirizine (ZYRTEC) 10 MG tablet Take 10 mg by mouth daily.    . Cholecalciferol (VITAMIN D3) 2000 UNITS TABS Take 2,000 Units by mouth at bedtime.     .  Ferrous Gluconate 324 (37.5 Fe) MG TABS Take 1 tablet by mouth every evening.    . metoprolol tartrate (LOPRESSOR) 25 MG tablet Take 12.5 mg by mouth 2 (two) times daily.    . Omega-3 Fatty Acids (OMEGA-3 FISH OIL PO) Take 1,000 mg by mouth daily.    . Probiotic Product (ADVANCED PROBIOTIC PO) Take 1 capsule by mouth daily.    . sertraline (ZOLOFT) 50 MG tablet Take 50 mg by mouth at bedtime.     . simvastatin (ZOCOR) 20 MG tablet Take 20 mg by mouth daily at 6 PM.     . triamcinolone cream (KENALOG) 0.1 % Apply 1 application topically 2 (two) times daily as needed (for rash).    . vitamin C (ASCORBIC ACID) 500 MG tablet Take 500 mg by mouth every evening. With iron    . alendronate (FOSAMAX) 70 MG tablet Take 1 tablet (70 mg total) by mouth once a week. Take with a full glass of water on an empty stomach. (Patient not taking: Reported on 04/08/2018) 4 tablet 6  . HYDROcodone-acetaminophen (NORCO) 5-325 MG tablet Take 1 tablet by mouth every 6 (six) hours as needed for moderate pain. (Patient not taking: Reported on 04/08/2018) 25 tablet 0   No current facility-administered medications for this visit.     Allergies as of 04/08/2018 - Review Complete 04/08/2018  Allergen Reaction Noted  . Bextra [valdecoxib] Nausea Only 08/24/2011  . Tramadol Nausea Only 08/24/2011    Family History  Problem Relation Age of Onset  . Hypertension Mother   . Hyperlipidemia Mother   . Stroke Mother        CAD  . Diabetes Mother   . Hypertension Father        CAD  . Diabetes Father   . Diabetes Sister   . Hypertension Brother   . Hyperlipidemia Brother   . Colon cancer Neg Hx   . Breast cancer Neg Hx     Social History   Socioeconomic History  . Marital status: Single    Spouse name: Not on file  . Number of children: 1  . Years of education: Not on file  . Highest education level: Not on file  Occupational History  . 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  Occupation: cna    Comment: retired  . Occupation: daycare    Comment: retired  Scientific laboratory technician  . Financial resource strain: Not on file  . Food insecurity:    Worry: Not on file    Inability: Not on file  . Transportation needs:    Medical: Not on file    Non-medical: Not on file  Tobacco Use  . Smoking status: Never Smoker  . Smokeless tobacco: Never Used  . Tobacco comment: Never smoked  Substance and Sexual Activity  . Alcohol use: No    Alcohol/week: 0.0  standard drinks  . Drug use: No  . Sexual activity: Not Currently    Birth control/protection: Post-menopausal  Lifestyle  . Physical activity:    Days per week: Not on file    Minutes per session: Not on file  . Stress: Not on file  Relationships  . Social connections:    Talks on phone: Not on file    Gets together: Not on file    Attends religious service: Not on file    Active member of club or organization: Not on file    Attends meetings of clubs or organizations: Not on file    Relationship status: Not on file  Other Topics Concern  . Not on file  Social History Narrative  . Not on file    Review of Systems: Gen: Denies fever, chills, anorexia. Denies fatigue, weakness, weight loss.  CV: Denies chest pain, palpitations, syncope, peripheral edema, and claudication. Resp: Denies dyspnea at rest, cough, wheezing, coughing up blood, and pleurisy. GI: see HPI  Derm: Denies rash, itching, dry skin Psych: Denies depression, anxiety, memory loss, confusion. No homicidal or suicidal ideation.  Heme: Denies bruising, bleeding, and enlarged lymph nodes.  Physical Exam: BP 126/79   Pulse (!) 53   Temp (!) 97 F (36.1 C) (Oral)   Ht 5\' 6"  (1.676 m)   Wt 208 lb 3.2 oz (94.4 kg)   BMI 33.60 kg/m  General:   Alert and oriented. No distress noted. Pleasant and cooperative.  Head:  Normocephalic and atraumatic. Eyes:  Conjuctiva clear without scleral icterus. Mouth:  Oral mucosa pink and moist. Good dentition. No  lesions. Abdomen:  +BS, soft, non-tender and non-distended. No rebound or guarding. No HSM or masses noted. Msk:  Symmetrical without gross deformities. Normal posture. Extremities:  Without edema. Neurologic:  Alert and  oriented x4 Psych:  Alert and cooperative. Normal mood and affect.

## 2018-04-12 NOTE — Progress Notes (Signed)
cc'ed to pcp °

## 2018-04-12 NOTE — Assessment & Plan Note (Signed)
Vague right-sided pain relieved after BM. Some unproductive stool. Gallbladder absent. No alarm signs. Add benefiber daily, call if no improvement. Return in 6 months.

## 2018-05-02 ENCOUNTER — Ambulatory Visit: Payer: Medicare Other | Admitting: "Endocrinology

## 2018-05-02 ENCOUNTER — Other Ambulatory Visit: Payer: Self-pay | Admitting: "Endocrinology

## 2018-05-02 DIAGNOSIS — E213 Hyperparathyroidism, unspecified: Secondary | ICD-10-CM

## 2018-05-02 DIAGNOSIS — E559 Vitamin D deficiency, unspecified: Secondary | ICD-10-CM

## 2018-05-17 LAB — PTH, INTACT AND CALCIUM
Calcium: 10.6 mg/dL — ABNORMAL HIGH (ref 8.6–10.4)
PTH: 62 pg/mL (ref 14–64)

## 2018-05-17 LAB — VITAMIN D 25 HYDROXY (VIT D DEFICIENCY, FRACTURES): Vit D, 25-Hydroxy: 29 ng/mL — ABNORMAL LOW (ref 30–100)

## 2018-06-10 ENCOUNTER — Encounter: Payer: Self-pay | Admitting: "Endocrinology

## 2018-06-10 ENCOUNTER — Ambulatory Visit (INDEPENDENT_AMBULATORY_CARE_PROVIDER_SITE_OTHER): Payer: Medicare Other | Admitting: "Endocrinology

## 2018-06-10 VITALS — BP 124/71 | HR 60 | Ht 66.0 in | Wt 210.0 lb

## 2018-06-10 DIAGNOSIS — E559 Vitamin D deficiency, unspecified: Secondary | ICD-10-CM | POA: Diagnosis not present

## 2018-06-10 DIAGNOSIS — E213 Hyperparathyroidism, unspecified: Secondary | ICD-10-CM | POA: Diagnosis not present

## 2018-06-10 DIAGNOSIS — M816 Localized osteoporosis [Lequesne]: Secondary | ICD-10-CM

## 2018-06-10 NOTE — Progress Notes (Signed)
Endocrinology follow-up note       06/10/2018, 10:08 AM  Ruth Hughes is a 63 y.o.-year-old female, referred by her PCP, Dr. Angelia Mould, for evaluation for hypercalcemia/hyperparathyroidism.   Past Medical History:  Diagnosis Date  . Anxiety   . Cancer [facility]) 2005   polyps  . Degenerative joint disease    s/p right THA; left THA planned for 03/2012  . Depression   . GERD (gastroesophageal reflux disease)    Hiatal hernia  . Goiter   . Hx of adenomatous colonic polyps   . Hyperlipidemia    H/o PVCs  . Hypertension   . PONV (postoperative nausea and vomiting)   . Spinal stenosis    CT in 07/2010: L3-4; degenerative joint disease at L4-5    Past Surgical History:  Procedure Laterality Date  . CHOLECYSTECTOMY N/A 08/27/2017   Procedure: LAPAROSCOPIC CHOLECYSTECTOMY;  Surgeon: Aviva Signs, MD;  Location: [facility] ORS;  Service: General;  Laterality: N/A;  . COLONOSCOPY  01/07/2001   hyperplastic polyp/tubulovillous adenoma with severe atypia  . COLONOSCOPY     + ileoscopy: per patient has additional one in 2006, 2007 and Flex sig in 2008. Told next colonoscopy in 2013.   Marland Kitchen COLONOSCOPY  09/03/11   internal hemrrhoids/diverticulosis in the left colon/ TUBULAR ADENOMA of descending colon. random colon bx negative for microscopic colitis. next TCS 08/2016  . COLONOSCOPY N/A 02/25/2017   Dr. Oneida Alar: Three 2-3 mm polyps at splenic flexure, hepatic flexure, and ascending colon, three sessile polyps in distal sigmoid and ascending colon (4-5 mm), few small-mouthed diverticula in rect-sigmoid and sigmoid, external hemorrhoids. 3 year surveillance. (simple adenomas and benign polypoid lesions)  . COLONOSCOPY W/ POLYPECTOMY  12/14/2003   Dr. Patel-->1.5 cm size polyp with patchy whitish mucosa in sigmoid colon around base of polyp. Path-adenomatous polyp  . cubital tunnel repair Bilateral   . ESOPHAGOGASTRODUODENOSCOPY  2005   hiatal hernia, GERD  per patient  . ESOPHAGOGASTRODUODENOSCOPY  09/2000   Dr. Betti Cruz, minimal gastritis, bx no hpylori  . ESOPHAGOGASTRODUODENOSCOPY  09/03/11   small bowel bx negative for celiac, mild chronic inactive gastritis, no h.pylori.   . ESOPHAGOGASTRODUODENOSCOPY N/A 08/08/2013   Dr. Oneida Alar: gastritis, small hiatal hernia, multiple small sessile polyps  . KNEE SURGERY  03/04   right  . lipoma removal Right    arm  . POLYPECTOMY  02/25/2017   Procedure: POLYPECTOMY;  Surgeon: Danie Binder, MD;  Location: [facility];  Service: Endoscopy;;  ascending colonx3; hepatic flexure; splenic flexure  . TOTAL HIP ARTHROPLASTY  2001   Right  . TOTAL HIP ARTHROPLASTY  2013   right  . TOTAL HIP ARTHROPLASTY  08/2012   left    Social History   Tobacco Use  . Smoking status: Never Smoker  . Smokeless tobacco: Never Used  . Tobacco comment: Never smoked  Substance Use Topics  . Alcohol use: No    Alcohol/week: 0.0 standard drinks  . Drug use: No    Outpatient Encounter Medications as of 06/10/2018  Medication Sig  . Corn Dextrin (EQL FIBER SUPPLEMENT PO) Take by mouth 2 (two) times daily as needed.  . [DISCONTINUED] alendronate (FOSAMAX) 70 MG tablet Take 70 mg  by mouth once a week. Take with a full glass of water on an empty stomach.  Marland Kitchen acetaminophen (TYLENOL) 500 MG tablet Take 500 mg by mouth every 6 (six) hours as needed for mild pain or moderate pain.   Marland Kitchen aspirin 81 MG tablet Take 81 mg by mouth daily.  . cetirizine (ZYRTEC) 10 MG tablet Take 10 mg by mouth daily.  . Cholecalciferol (VITAMIN D3) 2000 UNITS TABS Take 2,000 Units by mouth at bedtime.   . Ferrous Gluconate 324 (37.5 Fe) MG TABS Take 1 tablet by mouth every evening.  . metoprolol tartrate (LOPRESSOR) 25 MG tablet Take 12.5 mg by mouth 2 (two) times daily.  . Omega-3 Fatty Acids (OMEGA-3 FISH OIL PO) Take 1,000 mg by mouth daily.  . Probiotic Product (ADVANCED PROBIOTIC PO) Take 1 capsule by mouth daily.  . sertraline (ZOLOFT) 50  MG tablet Take 50 mg by mouth at bedtime.   . simvastatin (ZOCOR) 20 MG tablet Take 20 mg by mouth daily at 6 PM.   . triamcinolone cream (KENALOG) 0.1 % Apply 1 application topically 2 (two) times daily as needed (for rash).  . vitamin C (ASCORBIC ACID) 500 MG tablet Take 500 mg by mouth every evening. With iron   No facility-administered encounter medications on file as of 06/10/2018.     Allergies  Allergen Reactions  . Bextra [Valdecoxib] Nausea Only  . Tramadol Nausea Only     HPI  Ruth Hughes was diagnosed with hypercalcemia prior to her 58th birthday, however did not require any intervention.  -Her prior work-up did not conclusively confirm primary hyperparathyroidism to require intervention.    She is currently on observation only. 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  After taking it for 2-3 weeks she did not tolerate causing oral lesions, and she does not want to restart.  She also expresses her dislike for Prolia nor IV bisphosphonates at this time.    -Her 24-hour urine calcium measurement did not reveal hypercalciuria.  He has no new complaints today.  She remains on vitamin D supplement 5000 units daily.   -Her most recent labs show PTH improving to 62, along with high normal calcium of 10.6. I reviewed pt's last DEXA scans:  Date L1-L4 T score FN T score 33% distal Radius  Oct 04, 2017 -1.7  -3.6       -Her  bone density reveals osteoporosis of the distal one third of her forearm. No prior history of fragility fractures or falls. No history of  kidney stones.  No history of CKD. Last BUN/Cr: Lab Results  Component Value Date   BUN 20 08/23/2017   CREATININE 0.81 08/23/2017    she is not on HCTZ or other thiazide therapy.  She has history of vitamin D deficiency currently on vitamin D supplements.   she is not on calcium supplements,  she eats dairy and green, leafy, vegetables on average  amounts.  she not a good historian, however reports a family history of hypercalcemia requiring neck surgery in 1 of her brothers in his 39s.   -Denies family history of pituitary, thyroid, pancreatic dysfunctions. 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   ROS:  Constitutional: + steady weight, no fatigue, no subjective hyperthermia, no subjective hypothermia Eyes: no blurry vision, no xerophthalmia ENT: no sore throat, no nodules palpated in throat,  no dysphagia/odynophagia, no hoarseness Musculoskeletal: no muscle/joint aches, + gait abnormality. Skin: no rashes Neurological: no tremors, no numbness, no tingling, no dizziness Psychiatric: no depression, no anxiety  PE: BP 124/71   Pulse 60   Ht 5\' 6"  (1.676 m)   Wt 210 lb (95.3 kg)   BMI 33.89 kg/m  Wt Readings from Last 3 Encounters:  06/10/18 210 lb (95.3 kg)  04/08/18 208 lb 3.2 oz (94.4 kg)  10/27/17 214 lb (97.1 kg)   Constitutional: + Obese for height, not in acute distress, normal state of mind Eyes: PERRLA, EOMI, no exophthalmos ENT: moist mucous membranes, no thyromegaly, no cervical lymphadenopathy Musculoskeletal: no gross deformities, strength intact in all four extremities Skin: moist, warm, no rashes Neurological: no tremor with outstretched hands, Deep tendon reflexes normal in all four extremities.   CMP ( most recent) CMP   Lab Results  Component Value Date   TSH 1.69 07/11/2011    Recent Results (from the past 2160 hour(s))  VITAMIN D 25 Hydroxy (Vit-D Deficiency, Fractures)     Status: Abnormal   Collection Time: 05/16/18  9:07 AM  Result Value Ref Range   Vit D, 25-Hydroxy 29 (L) 30 - 100 ng/mL    Comment: Vitamin D Status         25-OH Vitamin D: . Deficiency:                    <20 ng/mL Insufficiency:             20 - 29 ng/mL Optimal:                 > or = 30 ng/mL . For 25-OH Vitamin D  testing on patients on  D2-supplementation and patients for whom quantitation  of D2 and D3 fractions is required, the QuestAssureD(TM) 25-OH VIT D, (D2,D3), LC/MS/MS is recommended: order  code [id number] (patients >63yrs). . For more information on this test, go to: http://education.questdiagnostics.com/faq/FAQ163 (This link is being provided for  informational/educational purposes only.)   PTH, Intact and Calcium     Status: Abnormal   Collection Time: 05/16/18  9:07 AM  Result Value Ref Range   PTH 62 14 - 64 pg/mL    Comment: . Interpretive Guide    Intact PTH           Calcium ------------------    ----------           ------- Normal Parathyroid    Normal               Normal Hypoparathyroidism    Low or Low Normal    Low Hyperparathyroidism    Primary            Normal or High       High    Secondary          High                 Normal or Low    Tertiary           High                 High Non-Parathyroid    Hypercalcemia      Low or Low Normal    High .    Calcium 10.6 (H) 8.6 - 10.4 mg/dL    Assessment: 1. Hypercalcemia / Hyperparathyroidism  Plan: -Her previsit labs indicate stable mildly above normal calcium at 10.6, her PTH is 62 improving from  86.   Patient has had several instances of mildly elevated calcium, with the highest level being at 11.1 mg/dl in 2013.  Her prior 24-hour urine calcium measurement was negative for hypercalciuria. This is against a diagnosis of primary hyperparathyroidism, may indicate a component of [place]. -She has recent bone density which shows osteoporosis mainly on distal one third of her forearm-did not tolerate alendronate, and does not want to be treated with Prolia nor IV Reclast for now.    -She gave a history consistent with neck surgery for calcium problem in 1 of her brothers in his 28s. -She is not a surgical candidate at this time.  No history of fragility fractures. No abdominal pain, no major mood disorders, no bone pain. -She  will be continued on observation status, continued vitamin D 3 2000 units daily and avoid any calcium supplements.  Will return in 1 year with PTH/calcium, and 24-hour urine calcium measurement.  She will have repeat bone density in May 2021 and will be reapproached for any other options of treatment if osteoporosis gets worse. - Return in about 1 year (around 06/11/2019) for Follow up with Pre-visit Labs, 24 Hours Urine Calcium and Creatinine.    - Time spent with the patient: 15 min, of which >50% was spent in reviewing her  current and  previous labs, previous DEXA scan, previous treatments, and medications doses and developing a plan for long-term care.  Ruth Hughes participated in the discussions, expressed understanding, and voiced agreement with the above plans.  All questions were answered to her satisfaction. she is encouraged to contact clinic should she have any questions or concerns prior to her return visit.   Glade Lloyd, MD [facility] [address] [place], [place] [address] Phone: [phone]  Fax: [phone]    This note was partially dictated with voice recognition software. Similar sounding words can be transcribed inadequately or may not  be corrected upon review.  06/10/2018, 10:08 AM

## 2018-10-12 ENCOUNTER — Other Ambulatory Visit: Payer: Self-pay

## 2018-10-12 ENCOUNTER — Ambulatory Visit: Payer: Medicaid Other | Admitting: Gastroenterology

## 2018-10-12 ENCOUNTER — Encounter: Payer: Self-pay | Admitting: Gastroenterology

## 2018-10-12 NOTE — Progress Notes (Deleted)
Referring Provider:  Primary Care Physician:  Alfonse Flavors, MD  Primary GI:   Virtual Visit via Telephone Note Due to COVID-19, visit is conducted virtually and was requested by patient.   I connected with Ruth Hughes on 10/12/18 at 10:00 AM EDT by telephone and verified that I am speaking with the correct person using two identifiers.   I discussed the limitations, risks, security and privacy concerns of performing an evaluation and management service by telephone and the availability of in person appointments. I also discussed with the patient that there may be a patient responsible charge related to this service. The patient expressed understanding and agreed to proceed.  Chief Complaint  Patient presents with  . Abdominal Pain    right side pain, comes/goes     History of Present Illness: 63 y.o. female presenting today with a history of GERD, gastritis, adenomas. Recently completed colonoscopy in Sept 2018 with multiple adenomas, and she will be due for surveillance in 2021. Due to RUQ pain, early satiety, updated HIDA, which revealing abnormally low EF at 20% and was symptomatic during test. Underwent lap chole March 2019.  Past Medical History:  Diagnosis Date  . Anxiety   . Cancer [facility]) 2005   polyps  . Degenerative joint disease    s/p right THA; left THA planned for 03/2012  . Depression   . GERD (gastroesophageal reflux disease)    Hiatal hernia  . Goiter   . Hx of adenomatous colonic polyps   . Hyperlipidemia    H/o PVCs  . Hypertension   . PONV (postoperative nausea and vomiting)   . Spinal stenosis    CT in 07/2010: L3-4; degenerative joint disease at L4-5     Past Surgical History:  Procedure Laterality Date  . CHOLECYSTECTOMY N/A 08/27/2017   Procedure: LAPAROSCOPIC CHOLECYSTECTOMY;  Surgeon: Aviva Signs, MD;  Location: [facility] ORS;  Service: General;  Laterality: N/A;  . COLONOSCOPY  01/07/2001   hyperplastic polyp/tubulovillous adenoma with  severe atypia  . COLONOSCOPY     + ileoscopy: per patient has additional one in 2006, 2007 and Flex sig in 2008. Told next colonoscopy in 2013.   Marland Kitchen COLONOSCOPY  09/03/11   internal hemrrhoids/diverticulosis in the left colon/ TUBULAR ADENOMA of descending colon. random colon bx negative for microscopic colitis. next TCS 08/2016  . COLONOSCOPY N/A 02/25/2017   Dr. Oneida Alar: Three 2-3 mm polyps at splenic flexure, hepatic flexure, and ascending colon, three sessile polyps in distal sigmoid and ascending colon (4-5 mm), few small-mouthed diverticula in rect-sigmoid and sigmoid, external hemorrhoids. 3 year surveillance. (simple adenomas and benign polypoid lesions)  . COLONOSCOPY W/ POLYPECTOMY  12/14/2003   Dr. Patel-->1.5 cm size polyp with patchy whitish mucosa in sigmoid colon around base of polyp. Path-adenomatous polyp  . cubital tunnel repair Bilateral   . ESOPHAGOGASTRODUODENOSCOPY  2005   hiatal hernia, GERD per patient  . ESOPHAGOGASTRODUODENOSCOPY  09/2000   Dr. Betti Cruz, minimal gastritis, bx no hpylori  . ESOPHAGOGASTRODUODENOSCOPY  09/03/11   small bowel bx negative for celiac, mild chronic inactive gastritis, no h.pylori.   . ESOPHAGOGASTRODUODENOSCOPY N/A 08/08/2013   Dr. Oneida Alar: gastritis, small hiatal hernia, multiple small sessile polyps  . KNEE SURGERY  03/04   right  . lipoma removal Right    arm  . POLYPECTOMY  02/25/2017   Procedure: POLYPECTOMY;  Surgeon: Danie Binder, MD;  Location: [facility];  Service: Endoscopy;;  ascending colonx3; hepatic flexure; splenic flexure  . TOTAL HIP  ARTHROPLASTY  2001   Right  . TOTAL HIP ARTHROPLASTY  2013   right  . TOTAL HIP ARTHROPLASTY  08/2012   left     Current Meds  Medication Sig  . acetaminophen (TYLENOL) 500 MG tablet Take 500 mg by mouth every 6 (six) hours as needed for mild pain or moderate pain.   Marland Kitchen aspirin 81 MG tablet Take 81 mg by mouth daily.  . cetirizine (ZYRTEC) 10 MG tablet Take 10 mg by mouth daily.  .  Cholecalciferol (VITAMIN D3) 2000 UNITS TABS Take 2,000 Units by mouth at bedtime.   Marland Kitchen Corn Dextrin (EQL FIBER SUPPLEMENT PO) Take by mouth 2 (two) times daily as needed.  . Ferrous Gluconate 324 (37.5 Fe) MG TABS Take 1 tablet by mouth every evening.  . metoprolol tartrate (LOPRESSOR) 25 MG tablet Take 12.5 mg by mouth 2 (two) times daily.  . Omega-3 Fatty Acids (OMEGA-3 FISH OIL PO) Take 1,000 mg by mouth daily.  . Probiotic Product (ADVANCED PROBIOTIC PO) Take 1 capsule by mouth daily.  . sertraline (ZOLOFT) 50 MG tablet Take 50 mg by mouth at bedtime.   . simvastatin (ZOCOR) 20 MG tablet Take 20 mg by mouth daily at 6 PM.   . triamcinolone cream (KENALOG) 0.1 % Apply 1 application topically 2 (two) times daily as needed (for rash).  . vitamin C (ASCORBIC ACID) 500 MG tablet Take 500 mg by mouth every evening. With iron     Family History  Problem Relation Age of Onset  . Hypertension Mother   . Hyperlipidemia Mother   . Stroke Mother        CAD  . Diabetes Mother   . Hypertension Father        CAD  . Diabetes Father   . Diabetes Sister   . Hypertension Brother   . Hyperlipidemia Brother   . Colon cancer Neg Hx   . Breast cancer Neg Hx     Social History   Socioeconomic History  . Marital status: Single    Spouse name: Not on file  . Number of children: 1  . Years of education: Not on file  . Highest education level: Not on file  Occupational History  . 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 Occupation: cna    Comment: retired  . Occupation: daycare    Comment: retired  Scientific laboratory technician  . Financial resource strain: Not on file  . Food insecurity:    Worry: Not on file    Inability: Not on file  . Transportation needs:    Medical: Not on file    Non-medical: Not on file  Tobacco Use  . Smoking status: Never Smoker  . Smokeless tobacco: Never Used  . Tobacco comment: Never smoked  Substance and Sexual Activity  . Alcohol use: No     Alcohol/week: 0.0 standard drinks  . Drug use: No  . Sexual activity: Not Currently    Birth control/protection: Post-menopausal  Lifestyle  . Physical activity:    Days per week: Not on file    Minutes per session: Not on file  . Stress: Not on file  Relationships  . Social connections:    Talks on phone: Not on file    Gets together: Not on file    Attends religious service: Not on file    Active member of club or organization: Not on file    Attends meetings of clubs or organizations: Not on  file    Relationship status: Not on file  Other Topics Concern  . Not on file  Social History Narrative  . Not on file       Review of Systems: Gen: Denies fever, chills, anorexia. Denies fatigue, weakness, weight loss.  CV: Denies chest pain, palpitations, syncope, peripheral edema, and claudication. Resp: Denies dyspnea at rest, cough, wheezing, coughing up blood, and pleurisy. GI: see HPI Derm: Denies rash, itching, dry skin Psych: Denies depression, anxiety, memory loss, confusion. No homicidal or suicidal ideation.  Heme: Denies bruising, bleeding, and enlarged lymph nodes.  Observations/Objective: No distress. Unable to perform physical exam due to telephone encounter. No video available.   Assessment and Plan:   Follow Up Instructions:    I discussed the assessment and treatment plan with the patient. The patient was provided an opportunity to ask questions and all were answered. The patient agreed with the plan and demonstrated an understanding of the instructions.   The patient was advised to call back or seek an in-person evaluation if the symptoms worsen or if the condition fails to improve as anticipated.  I provided *** minutes of non-face-to-face time during this encounter.  Annitta Needs, PhD, ANP-BC [facility]

## 2018-10-18 ENCOUNTER — Encounter: Payer: Self-pay | Admitting: Gastroenterology

## 2018-10-18 ENCOUNTER — Other Ambulatory Visit: Payer: Self-pay

## 2018-10-18 ENCOUNTER — Ambulatory Visit (INDEPENDENT_AMBULATORY_CARE_PROVIDER_SITE_OTHER): Payer: Medicare (Managed Care) | Admitting: Gastroenterology

## 2018-10-18 DIAGNOSIS — R1011 Right upper quadrant pain: Secondary | ICD-10-CM

## 2018-10-18 MED ORDER — PANTOPRAZOLE SODIUM 40 MG PO TBEC: 40.0000 mg | DELAYED_RELEASE_TABLET | Freq: Every day | ORAL | 3 refills | Status: DC

## 2018-10-18 NOTE — Progress Notes (Addendum)
Primary Care Physician:  Zhou-Talbert, Elwyn Lade, MD  Primary GI: Dr. Oneida Alar   Virtual Visit via Telephone Note Due to COVID-19, visit is conducted virtually and was requested by patient.   I connected with Ruth Hughes on 10/18/18 at  2:30 PM EDT by telephone and verified that I am speaking with the correct person using two identifiers.   I discussed the limitations, risks, security and privacy concerns of performing an evaluation and management service by telephone and the availability of in person appointments. I also discussed with the patient that there may be a patient responsible charge related to this service. The patient expressed understanding and agreed to proceed.  Chief Complaint  Patient presents with  . Follow-up    Abd pain on right side after eating,Had swelling in ankles Sunday evening     History of Present Illness: 63 year old female with history of GERD, gastritis, multiple adenomas in Sept 2018 and surveillance in 2021. Chronic RUQ pain, s/p chole in March 2019. She continues to have intermittent right-sided discomfort at times that is mild, improved after BM.   Routine follow-up. Abdominal pain is better. RUQ pain where "gallbladder was removed". Goes away after BM. Underlying discomfort. Feels it all day. 2 or 3 out of a scale of 1-10. After BM, eases off, down to a 2. BM daily. Taking fiber, which she feels is helping. 2 teaspoons daily initially but now down to 1. Salty taste in mouth sometimes but drinks water, and it goes away. Salty taste for 2 weeks. Has dry mouth in the mornings. BM feels productive. Feels like she is emptying out. A couple times has had loose stool. If eats different, will have loose stool. Eats bland. Careful when she eats out. No N/V. Pain not exacerbated by eating. Sometimes has gas. Has to avoid dairy. Gas starts up after dairy. Takes Gas-x as needed.   No rectal bleeding. Dark on iron. Takes an 81 mg aspirin daily. Takes tylenol.    Ankles have been swelling for a few days. Not walking as much as she used to. Ankle edema improved today. Mild swelling during the day.   Past Medical History:  Diagnosis Date  . Anxiety   . Cancer [facility]) 2005   polyps  . Degenerative joint disease    s/p right THA; left THA planned for 03/2012  . Depression   . GERD (gastroesophageal reflux disease)    Hiatal hernia  . Goiter   . Hx of adenomatous colonic polyps   . Hyperlipidemia    H/o PVCs  . Hypertension   . PONV (postoperative nausea and vomiting)   . Spinal stenosis    CT in 07/2010: L3-4; degenerative joint disease at L4-5     Past Surgical History:  Procedure Laterality Date  . CHOLECYSTECTOMY N/A 08/27/2017   Procedure: LAPAROSCOPIC CHOLECYSTECTOMY;  Surgeon: Aviva Signs, MD;  Location: [facility]S;  Service: General;  Laterality: N/A;  . COLONOSCOPY  01/07/2001   hyperplastic polyp/tubulovillous adenoma with severe atypia  . COLONOSCOPY     + ileoscopy: per patient has additional one in 2006, 2007 and Flex sig in 2008. Told next colonoscopy in 2013.   Marland Kitchen COLONOSCOPY  09/03/11   internal hemrrhoids/diverticulosis in the left colon/ TUBULAR ADENOMA of descending colon. random colon bx negative for microscopic colitis. next TCS 08/2016  . COLONOSCOPY N/A 02/25/2017   Dr. Oneida Alar: Three 2-3 mm polyps at splenic flexure, hepatic flexure, and ascending colon, three sessile polyps in distal sigmoid and  ascending colon (4-5 mm), few small-mouthed diverticula in rect-sigmoid and sigmoid, external hemorrhoids. 3 year surveillance. (simple adenomas and benign polypoid lesions)  . COLONOSCOPY W/ POLYPECTOMY  12/14/2003   Dr. Patel-->1.5 cm size polyp with patchy whitish mucosa in sigmoid colon around base of polyp. Path-adenomatous polyp  . cubital tunnel repair Bilateral   . ESOPHAGOGASTRODUODENOSCOPY  2005   hiatal hernia, GERD per patient  . ESOPHAGOGASTRODUODENOSCOPY  09/2000   Dr. Betti Cruz, minimal gastritis, bx no hpylori  .  ESOPHAGOGASTRODUODENOSCOPY  09/03/11   small bowel bx negative for celiac, mild chronic inactive gastritis, no h.pylori.   . ESOPHAGOGASTRODUODENOSCOPY N/A 08/08/2013   Dr. Oneida Alar: gastritis, small hiatal hernia, multiple small sessile polyps  . KNEE SURGERY  03/04   right  . lipoma removal Right    arm  . POLYPECTOMY  02/25/2017   Procedure: POLYPECTOMY;  Surgeon: Danie Binder, MD;  Location[facility];  Service: Endoscopy;;  ascending colonx3; hepatic flexure; splenic flexure  . TOTAL HIP ARTHROPLASTY  2001   Right  . TOTAL HIP ARTHROPLASTY  2013   right  . TOTAL HIP ARTHROPLASTY  08/2012   left     Current Meds  Medication Sig  . acetaminophen (TYLENOL) 500 MG tablet Take 500 mg by mouth every 6 (six) hours as needed for mild pain or moderate pain.   Marland Kitchen aspirin 81 MG tablet Take 81 mg by mouth daily.  . cetirizine (ZYRTEC) 10 MG tablet Take 10 mg by mouth daily.  . Cholecalciferol (VITAMIN D3) 2000 UNITS TABS Take 2,000 Units by mouth at bedtime.   Marland Kitchen Corn Dextrin (EQL FIBER SUPPLEMENT PO) Take by mouth 2 (two) times daily as needed.  . Ferrous Gluconate 324 (37.5 Fe) MG TABS Take 1 tablet by mouth every evening.  . metoprolol tartrate (LOPRESSOR) 25 MG tablet Take 12.5 mg by mouth 2 (two) times daily.  . Omega-3 Fatty Acids (OMEGA-3 FISH OIL PO) Take 1,000 mg by mouth daily.  . Probiotic Product (ADVANCED PROBIOTIC PO) Take 1 capsule by mouth daily.  . sertraline (ZOLOFT) 50 MG tablet Take 50 mg by mouth at bedtime.   . simvastatin (ZOCOR) 20 MG tablet Take 20 mg by mouth daily at 6 PM.   . triamcinolone cream (KENALOG) 0.1 % Apply 1 application topically 2 (two) times daily as needed (for rash).  . vitamin C (ASCORBIC ACID) 500 MG tablet Take 500 mg by mouth every evening. With iron     Family History  Problem Relation Age of Onset  . Hypertension Mother   . Hyperlipidemia Mother   . Stroke Mother        CAD  . Diabetes Mother   . Hypertension Father        CAD  .  Diabetes Father   . Diabetes Sister   . Hypertension Brother   . Hyperlipidemia Brother   . Colon cancer Neg Hx   . Breast cancer Neg Hx     Social History   Socioeconomic History  . Marital status: Single    Spouse name: Not on file  . Number of children: 1  . Years of education: Not on file  . Highest education level: Not on file  Occupational History  . 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 Occupation: cna    Comment: retired  . Occupation: daycare    Comment: retired  Scientific laboratory technician  . Financial resource strain: Not on file  . Food insecurity:  Worry: Not on file    Inability: Not on file  . Transportation Hughes:    Medical: Not on file    Non-medical: Not on file  Tobacco Use  . Smoking status: Never Smoker  . Smokeless tobacco: Never Used  . Tobacco comment: Never smoked  Substance and Sexual Activity  . Alcohol use: No    Alcohol/week: 0.0 standard drinks  . Drug use: No  . Sexual activity: Not Currently    Birth control/protection: Post-menopausal  Lifestyle  . Physical activity:    Days per week: Not on file    Minutes per session: Not on file  . Stress: Not on file  Relationships  . Social connections:    Talks on phone: Not on file    Gets together: Not on file    Attends religious service: Not on file    Active member of club or organization: Not on file    Attends meetings of clubs or organizations: Not on file    Relationship status: Not on file  Other Topics Concern  . Not on file  Social History Narrative  . Not on file       Review of Systems: Gen: Denies fever, chills, anorexia. Denies fatigue, weakness, weight loss.  CV: Denies chest pain, palpitations, syncope, peripheral edema, and claudication. Resp: Denies dyspnea at rest, cough, wheezing, coughing up blood, and pleurisy. GI: see HPI Derm: Denies rash, itching, dry skin Psych: Denies depression, anxiety, memory loss, confusion. No homicidal or  suicidal ideation.  Heme: Denies bruising, bleeding, and enlarged lymph nodes.  Observations/Objective: No distress. Unable to perform physical exam due to telephone encounter. No video available.   Assessment and Plan: Very pleasant 63 year old female with history of chronic RUQ pain, constipation, gallbladder absent. Gastritis on prior EGDs and on 81 mg aspirin daily but no PPI. Will start Protonix once daily. Abdominal pain improved s/p BM: continue fiber. Likely multifactorial in this setting. No alarm signs/symptoms. Will request labs from PCP. If persistent pain despite PPI and continued bowel regimen, will pursue [place].   Jan 2020: Tbili 1.3, Alk Phos 65, AST 15, ALT 11, Creatinine 0.66, BUN 18, Hgb 14.1, Hct 42.4, platelets 277  Follow Up Instructions:    I discussed the assessment and treatment plan with the patient. The patient was provided an opportunity to ask questions and all were answered. The patient agreed with the plan and demonstrated an understanding of the instructions.   The patient was advised to call back or seek an in-person evaluation if the symptoms worsen or if the condition fails to improve as anticipated.  I provided 25 minutes of non-face-to-face time during this encounter.  Ruth Needs, PhD, ANP-BC [facility]

## 2018-10-18 NOTE — Patient Instructions (Signed)
Continue the fiber as you are doing.  I have sent in Protonix to take once each morning, 30 minutes prior to breakfast. I sent this to your pharmacy.  We will get blood work from your doctor.  Please ask your doctor if you still need to take aspirin. Please also tell your doctor about any worsening ankle swelling.   If pain persists, we will do an ultrasound.   We will see you in 3-4 months!  I enjoyed talking with you again today! As you know, I value our relationship and want to provide genuine, compassionate, and quality care. I welcome your feedback. If you receive a survey regarding your visit,  I greatly appreciate you taking time to fill this out. See you next time!  Annitta Needs, PhD, ANP-BC [facility]

## 2018-10-19 ENCOUNTER — Encounter: Payer: Self-pay | Admitting: Gastroenterology

## 2018-10-20 NOTE — Progress Notes (Signed)
cc'ed to pcp °

## 2019-01-02 ENCOUNTER — Telehealth: Payer: Self-pay | Admitting: Gastroenterology

## 2019-01-02 NOTE — Telephone Encounter (Signed)
[phone] PATIENT CALLED TO SAY THAT SHE TALKED TO HER PCP AND SHE WAS TOLD TO STOP TAKING PANTOPROZOLE AND METOPROLOL AND ASPIRIN, BUT HER PHARMACIST SAID THEY DO NOT WANT HER TO STOP ASPIRIN.  AND July 27 HER STOOL SAMPLE CAME BACK NEGATIVE.

## 2019-01-02 NOTE — Telephone Encounter (Signed)
Pt goes to PCP at [facility] and was told in July to stop pantoprazole for 2 weeks and then do a stool test. ( She did not know what it was for).   She asked if she should go back on the Pantoprazole and I told her the PCP should have told her to after her tests and she got results.  She said they also told her to stop Metoprolol and ASA that they did not see that she has had any cardiac problems.   She said she did have chest pains in 2012 and went to [facility], but her cardiac tests were negative.  She had gotten really upset at the neighbors dogs fighting when she had to go to ED.  She was started on Metoprolol and ASA then, but she does not know who has been giving her refills.  She has a follow up appointment 02/01/2019 with Neil Crouch, PA.   Forwarding to Roseanne Kaufman, NP who saw her in the office last.

## 2019-01-02 NOTE — Telephone Encounter (Signed)
LMOM to call.

## 2019-01-03 NOTE — Telephone Encounter (Signed)
LMOM to call.

## 2019-01-03 NOTE — Telephone Encounter (Signed)
Needs to resume Protonix.

## 2019-01-04 NOTE — Telephone Encounter (Signed)
PT is aware.

## 2019-01-11 ENCOUNTER — Other Ambulatory Visit: Payer: Self-pay | Admitting: Gastroenterology

## 2019-02-01 ENCOUNTER — Other Ambulatory Visit: Payer: Self-pay

## 2019-02-01 ENCOUNTER — Encounter: Payer: Self-pay | Admitting: Gastroenterology

## 2019-02-01 ENCOUNTER — Telehealth: Payer: Self-pay | Admitting: Gastroenterology

## 2019-02-01 ENCOUNTER — Ambulatory Visit: Payer: Medicaid Other | Admitting: Gastroenterology

## 2019-02-01 ENCOUNTER — Ambulatory Visit (INDEPENDENT_AMBULATORY_CARE_PROVIDER_SITE_OTHER): Payer: Medicare (Managed Care) | Admitting: Gastroenterology

## 2019-02-01 VITALS — BP 138/79 | HR 60 | Temp 98.2°F | Ht 66.0 in | Wt 223.8 lb

## 2019-02-01 DIAGNOSIS — R1011 Right upper quadrant pain: Secondary | ICD-10-CM

## 2019-02-01 DIAGNOSIS — K5900 Constipation, unspecified: Secondary | ICD-10-CM

## 2019-02-01 DIAGNOSIS — Z86010 Personal history of colonic polyps: Secondary | ICD-10-CM | POA: Diagnosis not present

## 2019-02-01 NOTE — Telephone Encounter (Signed)
Received records from PCP. Patient had H.pylori stool Ag negative in 11/2018 but this was done while on PPI. According to the note, they had her stop PPI for two weeks to retest.   Can we get a copy of last TWO h.pylori stool antigen tests and any labs?

## 2019-02-01 NOTE — Patient Instructions (Addendum)
Continue pantoprazole once daily before breakfast.   Stop probiotics for awhile. If you develop recurrent bloating or bowel irregularity, you can resume as needed. I usually recommend taking 1-2 months at a time and then take a break until recurrent symptoms.   Ultrasound and labs as scheduled.   Return office visit with Dr. Oneida Alar in four months.

## 2019-02-01 NOTE — Assessment & Plan Note (Addendum)
Persistent RUQ tenderness often food related. Significant improvement s/p cholecystectomy but residual milder pain almost daily. H/o gastritis on prior EGD. She is on ASA 81mg  daily but no other nsaids. Has been back on PPI for several months without noted relief. Pursue ruq u/s as per plan. Recheck labs. Further recommendations to follow.  Return ov with Dr. Oneida Alar in 3 months.

## 2019-02-01 NOTE — Assessment & Plan Note (Signed)
History of adenomatous colon polyps.  Due for colonoscopy September 2021.

## 2019-02-01 NOTE — Assessment & Plan Note (Signed)
Doing well on fiber supplement. Continue for now. Take a probiotic break. She has been on "forever". If recurrent bowel issues or bloating she can resume for 1 to 2 months at a time which should be sufficient.

## 2019-02-01 NOTE — Patient Instructions (Signed)
No PA needed for US abd RUQ per [employer] website: NOTE: This [place] Medicaid member does not require prior authorization for OUTPATIENT Radiology through [employer] or [place] [employer] at this time. 

## 2019-02-01 NOTE — Telephone Encounter (Signed)
Contacted pcp and they are to send that information

## 2019-02-01 NOTE — Progress Notes (Signed)
Received records from [facility].  January 2019: H. pylori stool antigen negative. March 2019: H. pylori urea breath test negative.

## 2019-02-01 NOTE — Progress Notes (Signed)
    Primary Care Physician: Zhou-Talbert, Serena S, MD  Primary Gastroenterologist:  Sandi Fields, MD   Chief Complaint  Patient presents with  . Abdominal Pain    Right side, comes/goes    HPI: Ruth Hughes is a 63 y.o. female here for follow-up.  She was seen and may be a telemedicine visit.  She has a history of GERD, gastritis, constipation, multiple adenomas in September 2018 and surveillance due in 2021.  She has chronic right upper quadrant pain, status post cholecystectomy in March 2019.  At her last office visit she was complaining of ongoing right upper quadrant pain.  Because of her history of gastritis, she was restarted on pantoprazole.  Jan 2020: Tbili 1.3, Alk Phos 65, AST 15, ALT 11, Creatinine 0.66, BUN 18, Hgb 14.1, Hct 42.4, platelets 277  Chronic ruq pain. Similar to what she had before her gb was removed but not as bad now. About 2-3 out of 10 on scale. Recently heme negative. Pain worse with stress. Sometimes worse with meals and will act up and "vibrate". Certain foods worse, like fast foods. That will also cause bloating. None in the past year. Cooks very bland diet. Eats spinach. Has gained weight with pandemic, not exercising as much. [facility] [place] in her neighborhood bit kids and they had to have rabies shots. Chocolate bothers her. Lactose intolerant as well. If eats too much then aggravates RUQ. No heartburn. No dysphagia. BM are regular. No blood in stool.   Wt Readings from Last 3 Encounters:  02/01/19 223 lb 12.8 oz (101.5 kg)  06/10/18 210 lb (95.3 kg)  04/08/18 208 lb 3.2 oz (94.4 kg)    Current Outpatient Medications  Medication Sig Dispense Refill  . acetaminophen (TYLENOL) 500 MG tablet Take 500 mg by mouth every 6 (six) hours as needed for mild pain or moderate pain.     . aspirin 81 MG tablet Take 81 mg by mouth daily.    . cetirizine (ZYRTEC) 10 MG tablet Take 10 mg by mouth daily.    . Cholecalciferol (VITAMIN D3)  2000 UNITS TABS Take 2,000 Units by mouth at bedtime.     . Corn Dextrin (EQL FIBER SUPPLEMENT PO) Take by mouth 2 (two) times daily as needed.    . Ferrous Gluconate 324 (37.5 Fe) MG TABS Take 1 tablet by mouth every evening.    . neomycin-polymyxin-hydrocortisone (CORTISPORIN) 3.5-10000-1 OTIC suspension 4 (four) times daily.    . Omega-3 Fatty Acids (OMEGA-3 FISH OIL PO) Take 1,000 mg by mouth daily.    . pantoprazole (PROTONIX) 40 MG tablet TAKE 1 TABLET BY MOUTH ONCE DAILY TAKE 30 MINUTES BEFORE BREAKFAST 28 tablet 5  . Probiotic Product (ADVANCED PROBIOTIC PO) Take 1 capsule by mouth daily.    . sertraline (ZOLOFT) 50 MG tablet Take 50 mg by mouth at bedtime.     . simvastatin (ZOCOR) 20 MG tablet Take 20 mg by mouth daily at 6 PM.     . triamcinolone cream (KENALOG) 0.1 % Apply 1 application topically 2 (two) times daily as needed (for rash).    . vitamin C (ASCORBIC ACID) 500 MG tablet Take 500 mg by mouth every evening. With iron     No current facility-administered medications for this visit.     Allergies as of 02/01/2019 - Review Complete 02/01/2019  Allergen Reaction Noted  . Bextra [valdecoxib] Nausea Only 08/24/2011  . Tramadol Nausea Only 08/24/2011    ROS:  General:   Negative for anorexia, weight loss, fever, chills, fatigue, weakness. ENT: Negative for hoarseness, difficulty swallowing , nasal congestion. CV: Negative for chest pain, angina, palpitations, dyspnea on exertion, peripheral edema.  Respiratory: Negative for dyspnea at rest, dyspnea on exertion, cough, sputum, wheezing.  GI: See history of present illness. GU:  Negative for dysuria, hematuria, urinary incontinence, urinary frequency, nocturnal urination.  Endo: Negative for unusual weight change.    Physical Examination:   BP 138/79   Pulse 60   Temp 98.2 F (36.8 C) (Oral)   Ht 5' 6" (1.676 m)   Wt 223 lb 12.8 oz (101.5 kg)   BMI 36.12 kg/m   General: Well-nourished, well-developed in no acute  distress.  Eyes: No icterus. Mouth: Oropharyngeal mucosa moist and pink , no lesions erythema or exudate. Lungs: Clear to auscultation bilaterally.  Heart: Regular rate and rhythm, no murmurs rubs or gallops.  Abdomen: Bowel sounds are normal  nondistended, no hepatosplenomegaly or masses, no abdominal bruits or hernia , no rebound or guarding.  Mild ruq tenderness Extremities: No lower extremity edema. No clubbing or deformities. Neuro: Alert and oriented x 4   Skin: Warm and dry, no jaundice.   Psych: Alert and cooperative, normal mood and affect.  Labs:  See hpi   Imaging Studies: No results found.

## 2019-02-02 LAB — CBC WITH DIFFERENTIAL/PLATELET
Absolute Monocytes: 448 cells/uL (ref 200–950)
Basophils Absolute: 49 cells/uL (ref 0–200)
Basophils Relative: 0.7 %
Eosinophils Absolute: 147 cells/uL (ref 15–500)
Eosinophils Relative: 2.1 %
HCT: 40.3 % (ref 35.0–45.0)
Hemoglobin: 13.9 g/dL (ref 11.7–15.5)
Lymphs Abs: 2219 cells/uL (ref 850–3900)
MCH: 28.1 pg (ref 27.0–33.0)
MCHC: 34.5 g/dL (ref 32.0–36.0)
MCV: 81.6 fL (ref 80.0–100.0)
MPV: 10.9 fL (ref 7.5–12.5)
Monocytes Relative: 6.4 %
Neutro Abs: 4137 cells/uL (ref 1500–7800)
Neutrophils Relative %: 59.1 %
Platelets: 269 10*3/uL (ref 140–400)
RBC: 4.94 10*6/uL (ref 3.80–5.10)
RDW: 14.4 % (ref 11.0–15.0)
Total Lymphocyte: 31.7 %
WBC: 7 10*3/uL (ref 3.8–10.8)

## 2019-02-02 LAB — COMPREHENSIVE METABOLIC PANEL
AG Ratio: 1.8 (calc) (ref 1.0–2.5)
ALT: 12 U/L (ref 6–29)
AST: 15 U/L (ref 10–35)
Albumin: 4.1 g/dL (ref 3.6–5.1)
Alkaline phosphatase (APISO): 53 U/L (ref 37–153)
BUN: 15 mg/dL (ref 7–25)
CO2: 23 mmol/L (ref 20–32)
Calcium: 11 mg/dL — ABNORMAL HIGH (ref 8.6–10.4)
Chloride: 108 mmol/L (ref 98–110)
Creat: 0.71 mg/dL (ref 0.50–0.99)
Globulin: 2.3 g/dL (calc) (ref 1.9–3.7)
Glucose, Bld: 85 mg/dL (ref 65–139)
Potassium: 3.7 mmol/L (ref 3.5–5.3)
Sodium: 139 mmol/L (ref 135–146)
Total Bilirubin: 1.3 mg/dL — ABNORMAL HIGH (ref 0.2–1.2)
Total Protein: 6.4 g/dL (ref 6.1–8.1)

## 2019-02-07 ENCOUNTER — Other Ambulatory Visit: Payer: Self-pay

## 2019-02-09 ENCOUNTER — Other Ambulatory Visit: Payer: Self-pay

## 2019-02-09 ENCOUNTER — Ambulatory Visit (HOSPITAL_COMMUNITY)
Admission: RE | Admit: 2019-02-09 | Discharge: 2019-02-09 | Disposition: A | Discharge status: Home / Self Care | Payer: Medicare (Managed Care) | Source: Ambulatory Visit | Attending: Gastroenterology | Admitting: Gastroenterology

## 2019-02-09 DIAGNOSIS — R1011 Right upper quadrant pain: Secondary | ICD-10-CM | POA: Diagnosis not present

## 2019-02-09 DIAGNOSIS — Z86010 Personal history of colonic polyps: Secondary | ICD-10-CM | POA: Diagnosis present

## 2019-02-09 DIAGNOSIS — K5900 Constipation, unspecified: Secondary | ICD-10-CM | POA: Diagnosis present

## 2019-02-09 DIAGNOSIS — Z860101 Personal history of adenomatous and serrated colon polyps: Secondary | ICD-10-CM

## 2019-02-20 ENCOUNTER — Telehealth: Payer: Self-pay | Admitting: Gastroenterology

## 2019-02-20 NOTE — Telephone Encounter (Signed)
Pt has questions about her medicatiopantoprazole. [phone]

## 2019-02-20 NOTE — Telephone Encounter (Signed)
Spoke with pt. Pt says LSL wants pt to increase her Pantoprazole 40 mg to twice daily. Pt needs a new script sent to her pharmacy.

## 2019-03-09 ENCOUNTER — Telehealth: Payer: Self-pay

## 2019-03-09 NOTE — Telephone Encounter (Signed)
Pt said she though Magda Paganini told her to take Pantoprazole bid when she saw her and Rx has never been increased at the pharmacy.   I told her that per Leslie's note she was told to continue Pantoprazole daily.  She said she feels that she is doing well on once a day dosing.   I told her I will forward to [place] to advise!

## 2019-03-10 NOTE — Telephone Encounter (Signed)
Please apologize for confusion to the patient.   See ruq u/s result note 02/15/19: advised her to increase pantoprazole to bid for 2 weeks and call with PR after that.   See telephone note 9/21: patient called for RX for BID pantoprazole, note was not routed.   If she is doing well, ie RUQ improved, no reflux sx, then she can continue pantoprazole ONCE daily. If she is still having sx, then we can do BID. Let me know.   Plan for slf ov in 05/2019, on recall.

## 2019-03-13 NOTE — Telephone Encounter (Signed)
PT said she is doing well on the Pantoprazole qd. She is taking gas x some and it seems to help. Her pain is better. She is aware to follow up with Dr. Oneida Alar in Dec and is on recall. She requested a low fat diet and I am putting in the mail.

## 2019-03-13 NOTE — Telephone Encounter (Signed)
noted 

## 2019-03-17 LAB — COMPREHENSIVE METABOLIC PANEL
AG Ratio: 1.7 (calc) (ref 1.0–2.5)
ALT: 12 U/L (ref 6–29)
AST: 16 U/L (ref 10–35)
Albumin: 4 g/dL (ref 3.6–5.1)
Alkaline phosphatase (APISO): 65 U/L (ref 37–153)
BUN: 16 mg/dL (ref 7–25)
CO2: 26 mmol/L (ref 20–32)
Calcium: 10.8 mg/dL — ABNORMAL HIGH (ref 8.6–10.4)
Chloride: 107 mmol/L (ref 98–110)
Creat: 0.7 mg/dL (ref 0.50–0.99)
Globulin: 2.4 g/dL (calc) (ref 1.9–3.7)
Glucose, Bld: 86 mg/dL (ref 65–139)
Potassium: 3.9 mmol/L (ref 3.5–5.3)
Sodium: 140 mmol/L (ref 135–146)
Total Bilirubin: 1.2 mg/dL (ref 0.2–1.2)
Total Protein: 6.4 g/dL (ref 6.1–8.1)

## 2019-04-11 ENCOUNTER — Encounter: Payer: Self-pay | Admitting: Gastroenterology

## 2019-05-01 ENCOUNTER — Encounter: Payer: Self-pay | Admitting: Gastroenterology

## 2019-05-11 ENCOUNTER — Ambulatory Visit: Payer: Medicare (Managed Care) | Admitting: Gastroenterology

## 2019-05-15 ENCOUNTER — Other Ambulatory Visit: Payer: Self-pay

## 2019-05-15 DIAGNOSIS — E213 Hyperparathyroidism, unspecified: Secondary | ICD-10-CM

## 2019-05-25 LAB — CALCIUM, URINE, 24 HOUR: Calcium, 24H Urine: 66 mg/24 h

## 2019-05-25 LAB — CREATININE, URINE, 24 HOUR: Creatinine, 24H Ur: 0.73 g/(24.h) (ref 0.50–2.15)

## 2019-06-12 ENCOUNTER — Ambulatory Visit (INDEPENDENT_AMBULATORY_CARE_PROVIDER_SITE_OTHER): Payer: Medicare (Managed Care) | Admitting: "Endocrinology

## 2019-06-12 ENCOUNTER — Encounter: Payer: Self-pay | Admitting: "Endocrinology

## 2019-06-12 DIAGNOSIS — E559 Vitamin D deficiency, unspecified: Secondary | ICD-10-CM | POA: Diagnosis not present

## 2019-06-12 DIAGNOSIS — E213 Hyperparathyroidism, unspecified: Secondary | ICD-10-CM | POA: Diagnosis not present

## 2019-06-12 NOTE — Progress Notes (Signed)
06/12/2019, 8:27 PM                                 Endocrinology Telehealth Visit Follow up Note -During COVID -19 Pandemic  I connected with Ruth Hughes on 06/12/2019   by telephone and verified that I am speaking with the correct person using two identifiers. Ruth Hughes, [birth year]. she has verbally consented to this visit. All issues noted in this document were discussed and addressed. The format was not optimal for physical exam.  Ruth Hughes is a 64 y.o.-year-old female, referred by her PCP, Dr. Angelia Mould on telehealth via telephone in follow-up for hypercalcemia/hyperparathyroidism.   Past Medical History:  Diagnosis Date  . Anxiety   . Cancer [facility]) 2005   polyps  . Degenerative joint disease    s/p right THA; left THA planned for 03/2012  . Depression   . GERD (gastroesophageal reflux disease)    Hiatal hernia  . Goiter   . 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 Hx of adenomatous colonic polyps   . Hyperlipidemia    H/o PVCs  . Hypertension   . PONV (postoperative nausea and vomiting)   . Spinal stenosis    CT in 07/2010: L3-4; degenerative joint disease at L4-5    Past Surgical History:  Procedure Laterality Date  . CHOLECYSTECTOMY N/A 08/27/2017   Procedure: LAPAROSCOPIC CHOLECYSTECTOMY;  Surgeon: Aviva Signs, MD;  Location: [facility];  Service: General;  Laterality: N/A;  . COLONOSCOPY  01/07/2001   hyperplastic polyp/tubulovillous adenoma with severe atypia  . COLONOSCOPY     + ileoscopy: per patient has additional one in 2006, 2007 and Flex sig in 2008. Told next colonoscopy in 2013.   Marland Kitchen COLONOSCOPY  09/03/11   internal hemrrhoids/diverticulosis in the left colon/ TUBULAR ADENOMA of descending colon. random colon bx negative for microscopic colitis. next TCS 08/2016  . COLONOSCOPY N/A 02/25/2017   Dr. Oneida Alar: Three 2-3 mm polyps at splenic flexure, hepatic flexure, and ascending colon, three sessile polyps in distal sigmoid and  ascending colon (4-5 mm), few small-mouthed diverticula in rect-sigmoid and sigmoid, external hemorrhoids. 3 year surveillance. (simple adenomas and benign polypoid lesions)  . COLONOSCOPY W/ POLYPECTOMY  12/14/2003   Dr. Patel-->1.5 cm size polyp with patchy whitish mucosa in sigmoid colon around base of polyp. Path-adenomatous polyp  . cubital tunnel repair Bilateral   . ESOPHAGOGASTRODUODENOSCOPY  2005   hiatal hernia, GERD per patient  . ESOPHAGOGASTRODUODENOSCOPY  09/2000   Dr. Betti Cruz, minimal gastritis, bx no hpylori  . ESOPHAGOGASTRODUODENOSCOPY  09/03/11   small bowel bx negative for celiac, mild chronic inactive gastritis, no h.pylori.   . ESOPHAGOGASTRODUODENOSCOPY N/A 08/08/2013   Dr. Oneida Alar: gastritis, small hiatal hernia, multiple small sessile polyps  . KNEE SURGERY  03/04   right  . lipoma removal Right    arm  . POLYPECTOMY  02/25/2017   Procedure: POLYPECTOMY;  Surgeon: Danie Binder, MD;  Location: [facility];  Service: Endoscopy;;  ascending colonx3; hepatic flexure; splenic flexure  . TOTAL HIP ARTHROPLASTY  2001   Right  . TOTAL HIP ARTHROPLASTY  2013   right  . TOTAL HIP ARTHROPLASTY  08/2012   left    Social History   Tobacco Use  . Smoking status: Never Smoker  . Smokeless tobacco: Never Used  . Tobacco comment: Never smoked  Substance Use Topics  . Alcohol use: No    Alcohol/week: 0.0 standard drinks  . Drug use: No    Outpatient Encounter Medications as of 06/12/2019  Medication Sig  . acetaminophen (TYLENOL) 500 MG tablet Take 500 mg by mouth every 6 (six) hours as needed for mild pain or moderate pain.   Marland Kitchen aspirin 81 MG tablet Take 81 mg by mouth daily.  . cetirizine (ZYRTEC) 10 MG tablet Take 10 mg by mouth daily.  . Cholecalciferol (VITAMIN D3) 2000 UNITS TABS Take 2,000 Units by mouth at bedtime.   Marland Kitchen Corn Dextrin (EQL FIBER SUPPLEMENT PO) Take by mouth 2 (two) times daily as needed.  . Ferrous Gluconate 324 (37.5 Fe) MG TABS Take 1 tablet  by mouth every evening.  . neomycin-polymyxin-hydrocortisone (CORTISPORIN) 3.5-10000-1 OTIC suspension 4 (four) times daily.  . Omega-3 Fatty Acids (OMEGA-3 FISH OIL PO) Take 1,000 mg by mouth daily.  . pantoprazole (PROTONIX) 40 MG tablet TAKE 1 TABLET BY MOUTH ONCE DAILY TAKE 30 MINUTES BEFORE BREAKFAST  . Probiotic Product (ADVANCED PROBIOTIC PO) Take 1 capsule by mouth daily.  . sertraline (ZOLOFT) 50 MG tablet Take 50 mg by mouth at bedtime.   . simvastatin (ZOCOR) 20 MG tablet Take 20 mg by mouth daily at 6 PM.   . triamcinolone cream (KENALOG) 0.1 % Apply 1 application topically 2 (two) times daily as needed (for rash).  . vitamin C (ASCORBIC ACID) 500 MG tablet Take 500 mg by mouth every evening. With iron   No facility-administered encounter medications on file as of 06/12/2019.    Allergies  Allergen Reactions  . Bextra [Valdecoxib] Nausea Only  . Tramadol Nausea Only     HPI  Ruth Hughes was diagnosed with hypercalcemia prior to her 58th birthday, however did not require any intervention.  -Her prior work-up did not conclusively confirm primary hyperparathyroidism to require intervention.    She is currently on observation only. 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  After taking it for 2-3 weeks she did not tolerate, it caused oral lesions, and she does not want to restart.  She also expresses her dislike for Prolia nor IV bisphosphonates at this time.    -Her repeat 24-hour urine calcium measurement did not reveal hypercalciuria.  It was 66.   she has no new complaints today.  She remains on vitamin D supplement 5000 units daily.   -Her most recent labs show PTH improving to 62, along with high normal calcium of 10.6. I reviewed pt's last DEXA scans:  Date L1-L4 T score FN T score 33% distal Radius  Oct 04, 2017 -1.7  -3.6       -Her  bone density reveals osteoporosis of the distal one third of her  forearm. No prior history of fragility fractures or falls. No history of  kidney stones.  No history of CKD. Last BUN/Cr: Lab Results  Component Value Date   BUN 16 03/16/2019   CREATININE 0.70 03/16/2019    she is not on HCTZ or other thiazide therapy.   she is not on calcium supplements,  she eats dairy and green, leafy, vegetables on average amounts.  she not a good historian, however reports a family history of hypercalcemia requiring neck surgery in 1 of her brothers in his 75s.   -Denies family history of pituitary, thyroid, pancreatic dysfunctions. I reviewed her chart and she also has a history of diffuse osteoarthritis which required  bilateral  hip replacement in 2001 and 2014.   ROS: Limited as above.   PE: There were no vitals taken for this visit. Wt Readings from Last 3 Encounters:  02/01/19 223 lb 12.8 oz (101.5 kg)  06/10/18 210 lb (95.3 kg)  04/08/18 208 lb 3.2 oz (94.4 kg)     CMP ( most recent) CMP   Lab Results  Component Value Date   TSH 1.69 07/11/2011    Recent Results (from the past 2160 hour(s))  Comprehensive Metabolic Panel (CMET)     Status: Abnormal   Collection Time: 03/16/19 11:36 AM  Result Value Ref Range   Glucose, Bld 86 65 - 139 mg/dL    Comment: .        Non-fasting reference interval .    BUN 16 7 - 25 mg/dL   Creat 0.70 0.50 - 0.99 mg/dL    Comment: For patients >63 years of age, the reference limit for Creatinine is approximately 13% higher for people identified as African-American. .    BUN/Creatinine Ratio NOT APPLICABLE 6 - 22 (calc)   Sodium 140 135 - 146 mmol/L   Potassium 3.9 3.5 - 5.3 mmol/L   Chloride 107 98 - 110 mmol/L   CO2 26 20 - 32 mmol/L   Calcium 10.8 (H) 8.6 - 10.4 mg/dL   Total Protein 6.4 6.1 - 8.1 g/dL   Albumin 4.0 3.6 - 5.1 g/dL   Globulin 2.4 1.9 - 3.7 g/dL (calc)   AG Ratio 1.7 1.0 - 2.5 (calc)   Total Bilirubin 1.2 0.2 - 1.2 mg/dL   Alkaline phosphatase (APISO) 65 37 - 153 U/L   AST 16 10  - 35 U/L   ALT 12 6 - 29 U/L  Creatinine, urine, 24 hour     Status: None   Collection Time: 05/24/19  8:58 AM  Result Value Ref Range   Creatinine, 24H Ur 0.73 0.50 - 2.15 g/24 h  Calcium, urine, 24 hour     Status: None   Collection Time: 05/24/19  8:58 AM  Result Value Ref Range   Calcium, 24H Urine 66 mg/24 h    Comment:                           Reference Range  35-250                            Low calcium diet 35-200     Assessment: 1. Hypercalcemia / Hyperparathyroidism  Plan: -Her previsit labs indicate stable mildly above normal calcium at 10.6, her PTH is 62 improving from 86.  Her repeat 24-hour urine calcium did not reveal hypercalciuria at 66.  Her PTH and calcium are only modestly elevated.  -Her work-up so far  is against a diagnosis of primary hyperparathyroidism, may indicate a component of [place]. -She has recent bone density which shows osteoporosis mainly on distal one third of her forearm-did not tolerate alendronate, and does not want to be treated with Prolia nor IV Reclast for now.    -She gave a history consistent with neck surgery for calcium problem in 1 of her brothers in his 42s. -She is not a surgical candidate at this time.  No history of fragility fractures. No abdominal pain, no major mood disorders, no bone pain. -She will be continued on observation status, continued vitamin D 3 2000 units daily and avoid any calcium supplements.  Will  return in 6 months with repeat PTH/calcium, and vitamin D measurements.    She will have repeat bone density in May 2021 and will be reapproached for any other options of treatment if osteoporosis gets worse.  - Return in about 6 months (around 12/10/2019) for Follow up with Pre-visit Labs.      - Time spent on this patient care encounter:  25 minutes of which 50% was spent in  counseling and the rest reviewing  her current and  previous labs / studies and medications  doses and developing a plan for long term  care. Ruth Hughes  participated in the discussions, expressed understanding, and voiced agreement with the above plans.  All questions were answered to her satisfaction. she is encouraged to contact clinic should she have any questions or concerns prior to her return visit.  Glade Lloyd, MD [facility] [address] [place], [place] [address] Phone: [phone]  Fax: [phone]    This note was partially dictated with voice recognition software. Similar sounding words can be transcribed inadequately or may not  be corrected upon review.  06/12/2019, 8:27 PM

## 2019-06-22 ENCOUNTER — Ambulatory Visit (INDEPENDENT_AMBULATORY_CARE_PROVIDER_SITE_OTHER): Payer: Medicare (Managed Care) | Admitting: Gastroenterology

## 2019-06-22 ENCOUNTER — Other Ambulatory Visit: Payer: Self-pay

## 2019-06-22 ENCOUNTER — Encounter: Payer: Self-pay | Admitting: Gastroenterology

## 2019-06-22 DIAGNOSIS — R1013 Epigastric pain: Secondary | ICD-10-CM | POA: Diagnosis not present

## 2019-06-22 DIAGNOSIS — R0789 Other chest pain: Secondary | ICD-10-CM

## 2019-06-22 NOTE — Progress Notes (Signed)
Subjective:    Patient ID: Ruth Hughes, female    DOB: May 25, 1956, 64 y.o.   MRN: [id number]  Zhou-TalbertElwyn Lade, MD  HPI Questions about Covid test. Dr. Dorris Fetch and calcium level is normal and VITAMIN D CAN COME OFF WHEN WEATHER GETS WARM. PAIN IN RIGHT SIDE SHARP AND STABBING: JAN 2021. DOESN'T REMEMBER IF SHE ATE ANYTHING. FELT BLOATED. BETTER: TIME, BREATHE DIFFERENTLY. LASTED 10-15 MINS. WAS SITTING AT THE TIME. SITTING ON A WOODEN CHAIR. GAINED WEIGHT DUE TO MORE GROCERIES AND EATING DUE TO COVID. WALKING MORE BECAUSE IT HELPS THE PAIN. HAVING MILD PAIN TODAY. GETS WORSE AND GETS BETTER(MILD THEN SEVERE). HAS DVD FOR TAI CHI AND CHAIR EXERCISES.  RARE EPIGASTRIC PAIN: 1X/WEEK STRESS IS A BIG FACTOR. BMs: EVERY DAY, SOMETIMES SOFT BUT MOSTLY NORMAL.  PT DENIES FEVER, CHILLS, HEMATOCHEZIA, HEMATEMESIS, nausea, vomiting, melena, diarrhea, CHEST PAIN, SHORTNESS OF BREATH, CHANGE IN BOWEL IN HABITS, constipation, problems swallowing, OR heartburn or indigestion.  Past Medical History:  Diagnosis Date  . Anxiety   . Cancer [facility]) 2005   polyps  . Degenerative joint disease    s/p right THA; left THA planned for 03/2012  . Depression   . GERD (gastroesophageal reflux disease)    Hiatal hernia  . Goiter   . 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 Hx of adenomatous colonic polyps   . Hyperlipidemia    H/o PVCs  . Hypertension   . PONV (postoperative nausea and vomiting)   . Spinal stenosis    CT in 07/2010: L3-4; degenerative joint disease at L4-5   Past Surgical History:  Procedure Laterality Date  . CHOLECYSTECTOMY N/A 08/27/2017   Procedure: LAPAROSCOPIC CHOLECYSTECTOMY;  Surgeon: Aviva Signs, MD;  Location: [facility];  Service: General;  Laterality: N/A;  . COLONOSCOPY  01/07/2001   hyperplastic polyp/tubulovillous adenoma with severe atypia  . COLONOSCOPY     + ileoscopy: per patient has additional one in 2006, 2007 and Flex sig in 2008. Told next colonoscopy  in 2013.   Marland Kitchen COLONOSCOPY  09/03/11   internal hemrrhoids/diverticulosis in the left colon/ TUBULAR ADENOMA of descending colon. random colon bx negative for microscopic colitis. next TCS 08/2016  . COLONOSCOPY N/A 02/25/2017   Dr. Oneida Alar: Three 2-3 mm polyps at splenic flexure, hepatic flexure, and ascending colon, three sessile polyps in distal sigmoid and ascending colon (4-5 mm), few small-mouthed diverticula in rect-sigmoid and sigmoid, external hemorrhoids. 3 year surveillance. (simple adenomas and benign polypoid lesions)  . COLONOSCOPY W/ POLYPECTOMY  12/14/2003   Dr. Patel-->1.5 cm size polyp with patchy whitish mucosa in sigmoid colon around base of polyp. Path-adenomatous polyp  . cubital tunnel repair Bilateral   . ESOPHAGOGASTRODUODENOSCOPY  2005   hiatal hernia, GERD per patient  . ESOPHAGOGASTRODUODENOSCOPY  09/2000   Dr. Betti Cruz, minimal gastritis, bx no hpylori  . ESOPHAGOGASTRODUODENOSCOPY  09/03/11   small bowel bx negative for celiac, mild chronic inactive gastritis, no h.pylori.   . ESOPHAGOGASTRODUODENOSCOPY N/A 08/08/2013   Dr. Oneida Alar: gastritis, small hiatal hernia, multiple small sessile polyps  . KNEE SURGERY  03/04   right  . lipoma removal Right    arm  . POLYPECTOMY  02/25/2017   Procedure: POLYPECTOMY;  Surgeon: Danie Binder, MD;  Location: [facility];  Service: Endoscopy;;  ascending colonx3; hepatic flexure; splenic flexure  . TOTAL HIP ARTHROPLASTY  2001   Right  . TOTAL HIP ARTHROPLASTY  2013   right  . TOTAL HIP  ARTHROPLASTY  08/2012   left   Allergies  Allergen Reactions  . Bextra [Valdecoxib] Nausea Only  . Tramadol Nausea Only   Current Outpatient Medications  Medication Sig    . acetaminophen (TYLENOL) 500 MG tablet Take 500 mg by mouth every 6 (six) hours as needed for mild pain or moderate pain.     Marland Kitchen aspirin 81 MG tablet Take 81 mg by mouth daily.    . cetirizine (ZYRTEC) 10 MG tablet Take 10 mg by mouth daily.    Marland Kitchen VITAMIN D3 2000 UNITS  TABS Take 2,000 Units by mouth at bedtime.     .      . Ferrous Gluconate 324 (37.5 Fe) MG TABS Take 1 tablet by mouth every evening.    Marland Kitchen OMEGA-3 FISH OIL PO) Take 1,000 mg by mouth daily.    Marland Kitchen PROTONIX 40 MG tablet TAKE 30 MINUTES BEFORE BREAKFAST    . Probiotic Product Take 1 capsule by mouth daily.    . sertraline (ZOLOFT) 50 MG tablet Take 50 mg by mouth at bedtime.     . simvastatin (ZOCOR) 20 MG tablet Take 20 mg by mouth daily at 6 PM.     . triamcinolone cream (KENALOG) 0.1 % Apply 1 application topically BID as needed (for rash).    . vitamin C (ASCORBIC ACID) 500 MG tablet Take 500 mg by mouth every evening. With iron     Review of Systems PER HPI OTHERWISE ALL SYSTEMS ARE NEGATIVE.    Objective:   Physical Exam Constitutional:      General: She is not in acute distress.    Appearance: Normal appearance.  HENT:     Mouth/Throat:     Comments: MASK IN PLACE Eyes:     General: No scleral icterus.    Pupils: Pupils are equal, round, and reactive to light.  Cardiovascular:     Rate and Rhythm: Normal rate and regular rhythm.     Pulses: Normal pulses.     Heart sounds: Normal heart sounds.  Pulmonary:     Effort: Pulmonary effort is normal.     Breath sounds: Normal breath sounds.  Chest:     Chest wall: Tenderness (BILATERAL LOWER RIBS(R > L)) present.  Abdominal:     General: Bowel sounds are normal.     Palpations: Abdomen is soft.     Tenderness: There is no abdominal tenderness.  Musculoskeletal:     Cervical back: Normal range of motion.     Right lower leg: No edema.     Left lower leg: No edema.  Lymphadenopathy:     Cervical: No cervical adenopathy.  Skin:    General: Skin is warm and dry.  Neurological:     Mental Status: She is alert and oriented to person, place, and time.     Comments: NO  NEW FOCAL DEFICITS  Psychiatric:     Comments: NORMAL AFFECT, ANXIOUS MOOD       Assessment & Plan:

## 2019-06-22 NOTE — Assessment & Plan Note (Signed)
SYMPTOMS FAIRLY WELL CONTROLLED EXCEPT WHEN SHE IS STRESSED.  CONTINUE YOUR WEIGHT LOSS EFFORTS. CONTINUE PROTONIX. TAKE 30 MINUTES PRIOR TO BREAKFAST. FOLLOW UP IN 6 MOS.

## 2019-06-22 NOTE — Assessment & Plan Note (Signed)
SYMPTOMS NOT IDEALLY CONTROLLED and due to rib pain on right greater than left.  TO REDUCE SIDE PAIN:   1. CONTINUE EXERCISING. PRACTICE TAI CHI, CHAIR EXERCISES, CHAIR YOGA FOR 15-30 MINS 4 OR 5 TIMES A WEEK. CONTINUE WALKING.   2. ADD TURMERIC OR ALPHA LIPOIC ACID 250 MG TWICE DAILY.   **NATURAL ANTI-INFLAMMATORY SUPPLEMENT THAT IS AN ALTERNATIVE TO IBUPROFEN OR NAPROXEN**   3. USE TYLENOL AS NEEDED FOR ADDITIONAL PAIN RELIEF.     4.  APPLY HEAT TO RIGHT SIDE THREE TIMES A DAY. DO NOT APPLY DIRECTLY TO SKIN.  FOLLOW UP IN 6 MOS.

## 2019-06-22 NOTE — Patient Instructions (Addendum)
EAT TO LIVE AND THINK OF FOOD AS MEDICINE. 75% OF YOUR PLATE SHOULD BE FRUITS/VEGGIES.  To have more energy, and to lose weight:      1. CONTINUE YOUR WEIGHT LOSS EFFORTS. I RECOMMEND YOU READ AND FOLLOW RECOMMENDATIONS BY DR. MARK HYMAN, "10-DAY DETOX DIET".    2. If you must eat bread, EAT EZEKIEL BREAD. IT IS IN THE FROZEN SECTION OF THE GROCERY STORE.    3. DRINK WATER WITH FRUIT OR CUCUMBER ADDED. YOUR URINE SHOULD BE LIGHT YELLOW. AVOID SODA, GATORADE, ENERGY DRINKS, FRUIT JUICE, OR DIET SODA.     4. AVOID HIGH FRUCTOSE CORN SYRUP AND CAFFEINE.     5. DO NOT chew SUGAR FREE GUM OR USE ARTIFICIAL SWEETENERS. IF NEEDED USE STEVIA AS A SWEETENER.    6. DO NOT EAT ENRICHED WHEAT FLOUR, PASTA, RICE, OR CEREAL.    7. ONLY EAT WILD CAUGHT SEAFOOD, GRASS FED CHICKEN, PORK FROM  OR EGGS FROM PASTURE RAISED CHICKENS.    8. PRACTICE TAI CHI, CHAIR EXERCISES, CHAIR YOGA FOR 15-30 MINS 4 OR 5 TIMES A WEEK. CONTINUE WALKING.    9. START TAKING A MULTIVITAMIN AND VITAMIN B12 DAILY.    10. CONTINUE VITAMIN D3 2000 IU DAILY.   ADDITIONAL SUPPLEMENTS TO DECREASE CRAVING AND SUPPRESS YOUR APPETITE:    1. CINNAMON 500 MG EVERY AM PRIOR TO FIRST MEAL.   **STABILIZES BLOOD GLUCOSE/REDUCES CRAVINGS**    2. CHROMIUM 400-500 MG WITH MEALS TWICE DAILY.    **FAT BURNER**    3. GREEN TEA EXTRACT ONE DAILY.   **FAT BURNER/SUPPRESSES YOUR APPETITE**   TO REDUCE SIDE PAIN:   1. CONTINUE EXERCISING. PRACTICE TAI CHI, CHAIR EXERCISES, CHAIR YOGA FOR 15-30 MINS 4 OR 5 TIMES A WEEK. CONTINUE WALKING.    2. ADD TURMERIC OR ALPHA LIPOIC ACID 250 MG TWICE DAILY.   **NATURAL ANTI-INFLAMMATORY SUPPLEMENT THAT IS AN ALTERNATIVE TO IBUPROFEN OR NAPROXEN**    3. USE TYLENOL AS NEEDED FOR ADDITIONAL PAIN RELIEF.      4.  APPLY HEAT TO RIGHT or LEFT SIDE THREE TIMES A DAY. DO NOT APPLY DIRECTLY TO SKIN.  CONTINUE PROTONIX. TAKE 30 MINUTES PRIOR TO BREAKFAST.  FOLLOW UP IN 6 MOS.

## 2019-07-10 ENCOUNTER — Telehealth: Payer: Self-pay | Admitting: Gastroenterology

## 2019-07-10 ENCOUNTER — Other Ambulatory Visit: Payer: Self-pay | Admitting: Gastroenterology

## 2019-07-10 NOTE — Telephone Encounter (Signed)
[phone]  PLEASE CALL PATIENT, WANTS TO KNOW IF THE VITAMINS [facility] WANTED HER TO TAKE CAN CAUSE A RASH.  SHE HAS ONE

## 2019-07-10 NOTE — Telephone Encounter (Signed)
Pt said she started taking Vit B12, one a day vit, cinnamon and alpoic acid all at once when told to do so. She noticed a rash this morning between her breasts and on her right side and she wants to know if one of these could have caused it. She is not itching at all. She got some triamcinolone cream and applied and she feels it is going away already. Please advise!  Forwarding to Roseanne Kaufman, NP in the absence of Dr. Oneida Alar today.

## 2019-07-11 NOTE — Telephone Encounter (Signed)
REVIEWED. AGREE. NO ADDITIONAL RECOMMENDATIONS. 

## 2019-07-11 NOTE — Telephone Encounter (Signed)
PT is aware.

## 2019-07-11 NOTE — Telephone Encounter (Signed)
Possibly the cinnamon could have caused this. Would avoid that till further notice. Routing to Dr. Oneida Alar for further advice.

## 2019-08-31 ENCOUNTER — Telehealth: Payer: Self-pay | Admitting: Gastroenterology

## 2019-08-31 NOTE — Telephone Encounter (Signed)
Pt stated she d/c the chromum, green tea, one a day, tumeric, alpha lipoic and tumeric a few weeks ago. Pt  also d/c the cinnamon on 2/8 . However, she is still broke out in a rash and is not sure why. She stated she is still taking protonix and has been taking it for a long time and does not think that is related. Pt also wanted me to inform the provider that she did have the covid vaccine on 2/26 and 3/26

## 2019-08-31 NOTE — Telephone Encounter (Signed)
PLEASE CALL PT. She should let me know which one caused the rash.

## 2019-08-31 NOTE — Telephone Encounter (Signed)
Pt called to let SF know of the supplements she was taking for weight loss has caused her a rash and she stopped taking them on 08/14/2019. She was asking if SF could recommend something else. She said she was going for a walk and if she doesn't answer to [facility] or she will call back later. [phone]

## 2019-08-31 NOTE — Telephone Encounter (Signed)
Called lmom

## 2019-08-31 NOTE — Telephone Encounter (Signed)
Called pt lmom 

## 2019-09-05 NOTE — Telephone Encounter (Signed)
PLEASE CALL PT. SHE SHOULD SEE HER PCP TO EVALUATE THE RASH IF IT HAS CONTINUED AND SHE STOPPED THE SUPPLEMENTS ~2 MOS AGO.

## 2019-10-24 ENCOUNTER — Encounter: Payer: Self-pay | Admitting: Gastroenterology

## 2019-12-06 LAB — PTH, INTACT AND CALCIUM
Calcium: 11.1 mg/dL — ABNORMAL HIGH (ref 8.6–10.4)
PTH: 50 pg/mL (ref 14–64)

## 2019-12-06 LAB — VITAMIN D 25 HYDROXY (VIT D DEFICIENCY, FRACTURES): Vit D, 25-Hydroxy: 32 ng/mL (ref 30–100)

## 2019-12-12 ENCOUNTER — Encounter: Payer: Self-pay | Admitting: "Endocrinology

## 2019-12-12 ENCOUNTER — Other Ambulatory Visit: Payer: Self-pay

## 2019-12-12 ENCOUNTER — Ambulatory Visit (INDEPENDENT_AMBULATORY_CARE_PROVIDER_SITE_OTHER): Payer: Medicare (Managed Care) | Admitting: "Endocrinology

## 2019-12-12 VITALS — BP 116/86 | HR 60 | Ht 66.0 in | Wt 215.2 lb

## 2019-12-12 DIAGNOSIS — E213 Hyperparathyroidism, unspecified: Secondary | ICD-10-CM

## 2019-12-12 DIAGNOSIS — E559 Vitamin D deficiency, unspecified: Secondary | ICD-10-CM

## 2019-12-12 NOTE — Progress Notes (Signed)
12/12/2019  Endocrinology follow-up note   Ruth Hughes is a 64 y.o.-year-old female, referred by her PCP, Dr. Angelia Mould on telehealth via telephone in follow-up for hypercalcemia/hyperparathyroidism.   Past Medical History:  Diagnosis Date  . Anxiety   . Cancer [facility]) 2005   polyps  . Degenerative joint disease    s/p right THA; left THA planned for 03/2012  . Depression   . GERD (gastroesophageal reflux disease)    Hiatal hernia  . Goiter   . 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 Hx of adenomatous colonic polyps   . Hyperlipidemia    H/o PVCs  . Hypertension   . PONV (postoperative nausea and vomiting)   . Spinal stenosis    CT in 07/2010: L3-4; degenerative joint disease at L4-5    Past Surgical History:  Procedure Laterality Date  . CHOLECYSTECTOMY N/A 08/27/2017   Procedure: LAPAROSCOPIC CHOLECYSTECTOMY;  Surgeon: Aviva Signs, MD;  Location: [facility] ORS;  Service: General;  Laterality: N/A;  . COLONOSCOPY  01/07/2001   hyperplastic polyp/tubulovillous adenoma with severe atypia  . COLONOSCOPY     + ileoscopy: per patient has additional one in 2006, 2007 and Flex sig in 2008. Told next colonoscopy in 2013.   Marland Kitchen COLONOSCOPY  09/03/11   internal hemrrhoids/diverticulosis in the left colon/ TUBULAR ADENOMA of descending colon. random colon bx negative for microscopic colitis. next TCS 08/2016  . COLONOSCOPY N/A 02/25/2017   Dr. Oneida Alar: Three 2-3 mm polyps at splenic flexure, hepatic flexure, and ascending colon, three sessile polyps in distal sigmoid and ascending colon (4-5 mm), few small-mouthed diverticula in rect-sigmoid and sigmoid, external hemorrhoids. 3 year surveillance. (simple adenomas and benign polypoid lesions)  . COLONOSCOPY W/ POLYPECTOMY  12/14/2003   Dr. Patel-->1.5 cm size polyp with patchy whitish mucosa in sigmoid colon around base of polyp. Path-adenomatous polyp  . cubital tunnel repair Bilateral   . ESOPHAGOGASTRODUODENOSCOPY   2005   hiatal hernia, GERD per patient  . ESOPHAGOGASTRODUODENOSCOPY  09/2000   Dr. Betti Cruz, minimal gastritis, bx no hpylori  . ESOPHAGOGASTRODUODENOSCOPY  09/03/11   small bowel bx negative for celiac, mild chronic inactive gastritis, no h.pylori.   . ESOPHAGOGASTRODUODENOSCOPY N/A 08/08/2013   Dr. Oneida Alar: gastritis, small hiatal hernia, multiple small sessile polyps  . KNEE SURGERY  03/04   right  . lipoma removal Right    arm  . POLYPECTOMY  02/25/2017   Procedure: POLYPECTOMY;  Surgeon: Danie Binder, MD;  Location: [facility] SU[facility];  Service: Endoscopy;;  ascending colonx3; hepatic flexure; splenic flexure  . TOTAL HIP ARTHROPLASTY  2001   Right  . TOTAL HIP ARTHROPLASTY  2013   right  . TOTAL HIP ARTHROPLASTY  08/2012   left    Social History   Tobacco Use  . Smoking status: Never Smoker  . Smokeless tobacco: Never Used  . Tobacco comment: Never smoked  Vaping Use  . Vaping Use: Never used  Substance Use Topics  . Alcohol use: No    Alcohol/week: 0.0 standard drinks  . Drug use: No    Outpatient Encounter Medications as of 12/12/2019  Medication Sig  . ibuprofen (ADVIL) 200 MG tablet Take 200 mg by mouth every 6 (six) hours as needed.  . cetirizine (ZYRTEC) 10 MG tablet Take 10 mg by mouth daily.  . Cholecalciferol (VITAMIN D3) 2000 UNITS TABS Take 2,000 Units by mouth at bedtime.   Marland Kitchen Corn Dextrin (EQL FIBER SUPPLEMENT PO) Take  by mouth 2 (two) times daily as needed.  . Ferrous Gluconate 324 (37.5 Fe) MG TABS Take 1 tablet by mouth every evening.  . Omega-3 Fatty Acids (OMEGA-3 FISH OIL PO) Take 1,000 mg by mouth daily.  . pantoprazole (PROTONIX) 40 MG tablet TAKE 1 TABLET BY MOUTH ONCE DAILY TAKE 30 MINUTES BEFORE BREAKFAST  . Probiotic Product (ADVANCED PROBIOTIC PO) Take 1 capsule by mouth daily.  . sertraline (ZOLOFT) 50 MG tablet Take 50 mg by mouth at bedtime.   . simvastatin (ZOCOR) 20 MG tablet Take 20 mg by mouth daily at 6 PM.   . triamcinolone cream  (KENALOG) 0.1 % Apply 1 application topically 2 (two) times daily as needed (for rash).  . vitamin C (ASCORBIC ACID) 500 MG tablet Take 500 mg by mouth every evening. With iron  . [DISCONTINUED] acetaminophen (TYLENOL) 500 MG tablet Take 500 mg by mouth every 6 (six) hours as needed for mild pain or moderate pain.   . [DISCONTINUED] aspirin 81 MG tablet Take 81 mg by mouth daily.   No facility-administered encounter medications on file as of 12/12/2019.    Allergies  Allergen Reactions  . Bextra [Valdecoxib] Nausea Only  . Tramadol Nausea Only     HPI  Ruth Hughes was diagnosed with hypercalcemia prior to her 58th birthday, however did not require any intervention.  -Her prior work-up did not conclusively confirm primary hyperparathyroidism to require intervention.    She is currently on observation only.  Her previsit labs show hypercalcemia of 11.1 mg/dL, PTH at 50. 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  After taking it for 2-3 weeks she did not tolerate, it caused oral lesions, and she does not want to restart.  She also expresses her dislike for Prolia nor IV bisphosphonates at this time.    -Her prior  repeat 24-hour urine calcium measurement did not reveal hypercalciuria.  It was 66.   She is returning for follow-up, no new complaints today.  She remains on vitamin D3 2000 units daily.   I reviewed pt's last DEXA scans:  Date L1-L4 T score FN T score 33% distal Radius  Oct 04, 2017 -1.7  -3.6        -Her  bone density reveals osteoporosis of the distal one third of her forearm. No prior history of fragility fractures or falls. No history of  kidney stones.  No history of CKD. Last BUN/Cr: Lab Results  Component Value Date   BUN 16 03/16/2019   CREATININE 0.70 03/16/2019    she is not on HCTZ or other thiazide therapy.   she is not on calcium supplements,  she eats dairy and green, leafy, vegetables on  average amounts.  she not a good historian, however reports a family history of hypercalcemia requiring neck surgery in 1 of her brothers in his 36s.   -Denies family history of pituitary, thyroid, pancreatic dysfunctions. 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   ROS: Limited as above.   PE: BP 116/86   Pulse 60   Ht 5\' 6"  (1.676 m)   Wt 215 lb 3.2 oz (97.6 kg)   BMI 34.73 kg/m  Wt Readings from Last 3 Encounters:  12/12/19 215 lb 3.2 oz (97.6 kg)  06/22/19 220 lb (99.8 kg)  02/01/19 223 lb 12.8 oz (101.5 kg)     CMP (  most recent) CMP   Lab Results  Component Value Date   TSH 1.69 07/11/2011    Recent Results (from the past 2160 hour(s))  PTH, Intact and Calcium     Status: Abnormal   Collection Time: 12/05/19  9:26 AM  Result Value Ref Range   PTH 50 14 - 64 pg/mL    Comment: . Interpretive Guide    Intact PTH           Calcium ------------------    ----------           ------- Normal Parathyroid    Normal               Normal Hypoparathyroidism    Low or Low Normal    Low Hyperparathyroidism    Primary            Normal or High       High    Secondary          High                 Normal or Low    Tertiary           High                 High Non-Parathyroid    Hypercalcemia      Low or Low Normal    High .    Calcium 11.1 (H) 8.6 - 10.4 mg/dL  VITAMIN D 25 Hydroxy (Vit-D Deficiency, Fractures)     Status: None   Collection Time: 12/05/19  9:26 AM  Result Value Ref Range   Vit D, 25-Hydroxy 32 30 - 100 ng/mL    Comment: Vitamin D Status         25-OH Vitamin D: . Deficiency:                    <20 ng/mL Insufficiency:             20 - 29 ng/mL Optimal:                 > or = 30 ng/mL . For 25-OH Vitamin D testing on patients on  D2-supplementation and patients for whom quantitation  of D2 and D3 fractions is required, the [employer](TM) 25-OH VIT D,  (D2,D3), LC/MS/MS is recommended: order  code 574 086 0719 (patients >62yrs). See Note 1 . Note 1 . For additional information, please refer to  http://education.[facility].com/faq/FAQ199  (This link is being provided for informational/ educational purposes only.)     Assessment: 1. Hypercalcemia / Hyperparathyroidism  Plan: -Her previsit labs indicate her highest calcium of 11.1 mg per DL, present with PTH 50, generally improving from 86.  Her prior 24-hour urine calcium was unremarkable at 66.    -Her work-up so far  is not conclusive although she has osteoporosis mainly on distal one third of her forearm.  This may still be early mild primary hyper para.  She will need repeat 24-hour urine calcium measurement.    If she is found to have significant hypercalciuria, she will be considered for surgical consult.   -Regarding her osteoporosis,  She did not tolerate alendronate, and does not want to be treated with Prolia nor IV Reclast for now.    -She gave a history consistent with neck surgery for calcium problem in 1 of her brothers in his 54s.  No history of fragility fractures. No abdominal pain, no major mood disorders, no bone pain. -She will be  continued on observation status, continued vitamin D 3 2000 units daily and avoid any calcium supplements.  -She will return in 4 weeks for reevaluation.    She he is also due for repeat bone density, will be requested after her next visit.     - Return in about 4 weeks (around 01/09/2020) for 24 Hr Urine Ca & Cr, F/U with Pre-visit Labs.       - Time spent on this patient care encounter:  20 minutes of which 50% was spent in  counseling and the rest reviewing  her current and  previous labs / studies and medications  doses and developing a plan for long term care. Haskel Schroeder  participated in the discussions, expressed understanding, and voiced agreement with the above plans.  All questions were answered to her satisfaction. she  is encouraged to contact clinic should she have any questions or concerns prior to her return visit.   Glade Lloyd, MD [facility] [address] [place], [place] [address] Phone: [phone]  Fax: [phone]    This note was partially dictated with voice recognition software. Similar sounding words can be transcribed inadequately or may not  be corrected upon review.  12/12/2019, 1:09 PM

## 2019-12-16 LAB — CREATININE, URINE, 24 HOUR: Creatinine, 24H Ur: 0.8 g/(24.h) (ref 0.50–2.15)

## 2019-12-16 LAB — CALCIUM, URINE, 24 HOUR: Calcium, 24H Urine: 41 mg/24 h

## 2020-01-01 ENCOUNTER — Other Ambulatory Visit: Payer: Self-pay

## 2020-01-01 ENCOUNTER — Ambulatory Visit (INDEPENDENT_AMBULATORY_CARE_PROVIDER_SITE_OTHER): Payer: Medicare (Managed Care) | Admitting: Gastroenterology

## 2020-01-01 ENCOUNTER — Encounter: Payer: Self-pay | Admitting: Gastroenterology

## 2020-01-01 VITALS — BP 135/80 | HR 63 | Temp 97.8°F | Ht 66.0 in | Wt 216.6 lb

## 2020-01-01 DIAGNOSIS — Z86010 Personal history of colonic polyps: Secondary | ICD-10-CM

## 2020-01-01 DIAGNOSIS — K219 Gastro-esophageal reflux disease without esophagitis: Secondary | ICD-10-CM | POA: Diagnosis not present

## 2020-01-01 NOTE — Patient Instructions (Signed)
1. We will be in touch to schedule colonoscopy after you have completed your work up with Dr. Dorris Fetch.  2. Continue pantoprazole 40mg  daily.    Fatty Liver Disease  Fatty liver disease occurs when too much fat has built up in your liver cells. Fatty liver disease is also called hepatic steatosis or steatohepatitis. The liver removes harmful substances from your bloodstream and produces fluids that your body needs. It also helps your body use and store energy from the food you eat. In many cases, fatty liver disease does not cause symptoms or problems. It is often diagnosed when tests are being done for other reasons. However, over time, fatty liver can cause inflammation that may lead to more serious liver problems, such as scarring of the liver (cirrhosis) and liver failure. Fatty liver is associated with insulin resistance, increased body fat, high blood pressure (hypertension), and high cholesterol. These are features of metabolic syndrome and increase your risk for stroke, diabetes, and heart disease. What are the causes? This condition may be caused by:  Drinking too much alcohol.  Poor nutrition.  Obesity.  Cushing's syndrome.  Diabetes.  High cholesterol.  Certain drugs.  Poisons.  Some viral infections.  Pregnancy. What increases the risk? You are more likely to develop this condition if you:  Abuse alcohol.  Are overweight.  Have diabetes.  Have hepatitis.  Have a high triglyceride level.  Are pregnant. What are the signs or symptoms? Fatty liver disease often does not cause symptoms. If symptoms do develop, they can include:  Fatigue.  Weakness.  Weight loss.  Confusion.  Abdominal pain.  Nausea and vomiting.  Yellowing of your skin and the white parts of your eyes (jaundice).  Itchy skin. How is this diagnosed? This condition may be diagnosed by:  A physical exam and medical history.  Blood tests.  Imaging tests, such as an ultrasound, CT  scan, or MRI.  A liver biopsy. A small sample of liver tissue is removed using a needle. The sample is then looked at under a microscope. How is this treated? Fatty liver disease is often caused by other health conditions. Treatment for fatty liver may involve medicines and lifestyle changes to manage conditions such as:  Alcoholism.  High cholesterol.  Diabetes.  Being overweight or obese. Follow these instructions at home:   Do not drink alcohol. If you have trouble quitting, ask your health care provider how to safely quit with the help of medicine or a supervised program. This is important to keep your condition from getting worse.  Eat a healthy diet as told by your health care provider. Ask your health care provider about working with a diet and nutrition specialist (dietitian) to develop an eating plan.  Exercise regularly. This can help you lose weight and control your cholesterol and diabetes. Talk to your health care provider about an exercise plan and which activities are best for you.  Take over-the-counter and prescription medicines only as told by your health care provider.  Keep all follow-up visits as told by your health care provider. This is important. Contact a health care provider if: You have trouble controlling your:  Blood sugar. This is especially important if you have diabetes.  Cholesterol.  Drinking of alcohol. Get help right away if:  You have abdominal pain.  You have jaundice.  You have nausea and vomiting.  You vomit blood or material that looks like coffee grounds.  You have stools that are black, tar-like, or bloody. Summary  Fatty liver disease develops when too much fat builds up in the cells of your liver.  Fatty liver disease often causes no symptoms or problems. However, over time, fatty liver can cause inflammation that may lead to more serious liver problems, such as scarring of the liver (cirrhosis).  You are more likely to  develop this condition if you abuse alcohol, are pregnant, are overweight, have diabetes, have hepatitis, or have high triglyceride levels.  Contact your health care provider if you have trouble controlling your weight, blood sugar, cholesterol, or drinking of alcohol. This information is not intended to replace advice given to you by your health care provider. Make sure you discuss any questions you have with your health care provider. Document Revised: 04/30/2017 Document Reviewed: 02/24/2017 Elsevier Patient Education  2020 [employer].

## 2020-01-01 NOTE — Progress Notes (Signed)
Primary Care Physician:  Zhou-Talbert, Elwyn Lade, MD  Primary Gastroenterologist:  Formerly Barney Drain, MD   Chief Complaint  Patient presents with  . Consult    TCS last done 2018. Doing okay  . Abdominal Pain    Upper abd "burning" coes/goes about every other week    HPI:  Ruth Hughes is a 64 y.o. female here to schedule colonoscopy for history of multiple adenomatous colon polyps.  Last one was 3 years ago.  She also has a history of GERD, gastritis, fatty liver, constipation.  History of chronic right upper quadrant pain status post cholecystectomy March 2019.  She is following with Dr. Dorris Fetch for elevated serum calcium. She sees him 8/10 for follow up and will find out if she needs surgery. Sees PCP on the 13ths.   She completed her Moderna vaccines 07/2019.   She is here today to schedule her 3 year surveillance colonoscopy. BMs are doing ok. No melena, brbpr. No n/v. Heartburn well controlled. Still with intermittent RUQ pain but previously felt to be due to musculoskeletal component. Stomach gets a little upset with anxiety/stress.     Current Outpatient Medications  Medication Sig Dispense Refill  . cetirizine (ZYRTEC) 10 MG tablet Take 10 mg by mouth daily.    . Cholecalciferol (VITAMIN D3) 2000 UNITS TABS Take 2,000 Units by mouth at bedtime.     Marland Kitchen Corn Dextrin (EQL FIBER SUPPLEMENT PO) Take by mouth 2 (two) times daily as needed.    . Ferrous Gluconate 324 (37.5 Fe) MG TABS Take 1 tablet by mouth every evening.    Marland Kitchen ibuprofen (ADVIL) 200 MG tablet Take 200 mg by mouth every 6 (six) hours as needed.    . Omega-3 Fatty Acids (OMEGA-3 FISH OIL PO) Take 1,000 mg by mouth daily.    . pantoprazole (PROTONIX) 40 MG tablet TAKE 1 TABLET BY MOUTH ONCE DAILY TAKE 30 MINUTES BEFORE BREAKFAST 90 tablet 3  . Probiotic Product (ADVANCED PROBIOTIC PO) Take 1 capsule by mouth daily.    . sertraline (ZOLOFT) 50 MG tablet Take 50 mg by mouth at bedtime.     . simvastatin (ZOCOR) 20 MG  tablet Take 20 mg by mouth daily at 6 PM.     . triamcinolone cream (KENALOG) 0.1 % Apply 1 application topically 2 (two) times daily as needed (for rash).     No current facility-administered medications for this visit.    Allergies as of 01/01/2020 - Review Complete 01/01/2020  Allergen Reaction Noted  . Bextra [valdecoxib] Nausea Only 08/24/2011  . Tramadol Nausea Only 08/24/2011    Past Medical History:  Diagnosis Date  . Anxiety   . Cancer [facility]) 2005   polyps  . Degenerative joint disease    s/p right THA; left THA planned for 03/2012  . Depression   . GERD (gastroesophageal reflux disease)    Hiatal hernia  . Goiter   . 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 Hx of adenomatous colonic polyps   . Hyperlipidemia    H/o PVCs  . Hypertension   . PONV (postoperative nausea and vomiting)   . Spinal stenosis    CT in 07/2010: L3-4; degenerative joint disease at L4-5    Past Surgical History:  Procedure Laterality Date  . CHOLECYSTECTOMY N/A 08/27/2017   Procedure: LAPAROSCOPIC CHOLECYSTECTOMY;  Surgeon: Aviva Signs, MD;  Location: [facility] ORS;  Service: General;  Laterality: N/A;  . COLONOSCOPY  01/07/2001   hyperplastic polyp/tubulovillous adenoma  with severe atypia  . COLONOSCOPY     + ileoscopy: per patient has additional one in 2006, 2007 and Flex sig in 2008. Told next colonoscopy in 2013.   Marland Kitchen COLONOSCOPY  09/03/11   internal hemrrhoids/diverticulosis in the left colon/ TUBULAR ADENOMA of descending colon. random colon bx negative for microscopic colitis. next TCS 08/2016  . COLONOSCOPY N/A 02/25/2017   Dr. Oneida Alar: Three 2-3 mm polyps at splenic flexure, hepatic flexure, and ascending colon, three sessile polyps in distal sigmoid and ascending colon (4-5 mm), few small-mouthed diverticula in rect-sigmoid and sigmoid, external hemorrhoids. 3 year surveillance. (simple adenomas and benign polypoid lesions)  . COLONOSCOPY W/ POLYPECTOMY  12/14/2003   Dr. Patel-->1.5  cm size polyp with patchy whitish mucosa in sigmoid colon around base of polyp. Path-adenomatous polyp  . cubital tunnel repair Bilateral   . ESOPHAGOGASTRODUODENOSCOPY  2005   hiatal hernia, GERD per patient  . ESOPHAGOGASTRODUODENOSCOPY  09/2000   Dr. Betti Cruz, minimal gastritis, bx no hpylori  . ESOPHAGOGASTRODUODENOSCOPY  09/03/11   small bowel bx negative for celiac, mild chronic inactive gastritis, no h.pylori.   . ESOPHAGOGASTRODUODENOSCOPY N/A 08/08/2013   Dr. Oneida Alar: gastritis, small hiatal hernia, multiple small sessile polyps  . KNEE SURGERY  03/04   right  . lipoma removal Right    arm  . POLYPECTOMY  02/25/2017   Procedure: POLYPECTOMY;  Surgeon: Danie Binder, MD;  Location: [facility];  Service: Endoscopy;;  ascending colonx3; hepatic flexure; splenic flexure  . TOTAL HIP ARTHROPLASTY  2001   Right  . TOTAL HIP ARTHROPLASTY  2013   right  . TOTAL HIP ARTHROPLASTY  08/2012   left    Family History  Problem Relation Age of Onset  . Hypertension Mother   . Hyperlipidemia Mother   . Stroke Mother        CAD  . Diabetes Mother   . Hypertension Father        CAD  . Diabetes Father   . Diabetes Sister   . Hypertension Brother   . Hyperlipidemia Brother   . Colon cancer Neg Hx   . Breast cancer Neg Hx     Social History   Socioeconomic History  . Marital status: Single    Spouse name: Not on file  . Number of children: 1  . Years of education: Not on file  . Highest education level: Not on file  Occupational History  . 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 Occupation: cna    Comment: retired  . Occupation: daycare    Comment: retired  Tobacco Use  . Smoking status: Never Smoker  . Smokeless tobacco: Never Used  . Tobacco comment: Never smoked  Vaping Use  . Vaping Use: Never used  Substance and Sexual Activity  . Alcohol use: No    Alcohol/week: 0.0 standard drinks  . Drug use: No  . Sexual activity: Not Currently     Birth control/protection: Post-menopausal  Other Topics Concern  . Not on file  Social History Narrative  . Not on file   Social Determinants of Health   Financial Resource Strain:   . Difficulty of Paying Living Expenses:   Food Insecurity:   . Worried About Charity fundraiser in the Last Year:   . Arboriculturist in the Last Year:   Transportation Needs:   . Film/video editor (Medical):   Marland Kitchen Lack of Transportation (Non-Medical):   Physical Activity:   .  Days of Exercise per Week:   . Minutes of Exercise per Session:   Stress:   . Feeling of Stress :   Social Connections:   . Frequency of Communication with Friends and Family:   . Frequency of Social Gatherings with Friends and Family:   . Attends Religious Services:   . Active Member of Clubs or Organizations:   . Attends Archivist Meetings:   Marland Kitchen Marital Status:   Intimate Partner Violence:   . Fear of Current or Ex-Partner:   . Emotionally Abused:   Marland Kitchen Physically Abused:   . Sexually Abused:       ROS:  General: Negative for anorexia, weight loss, fever, chills, fatigue, weakness. Eyes: Negative for vision changes.  ENT: Negative for hoarseness, difficulty swallowing , nasal congestion. CV: Negative for chest pain, angina, palpitations, dyspnea on exertion, peripheral edema.  Respiratory: Negative for dyspnea at rest, dyspnea on exertion, cough, sputum, wheezing.  GI: See history of present illness. GU:  Negative for dysuria, hematuria, urinary incontinence, urinary frequency, nocturnal urination.  MS: positive for joint pain, low back pain.  Derm: Negative for rash or itching.  Neuro: Negative for weakness, abnormal sensation, seizure, frequent headaches, memory loss, confusion.  Psych: positive for anxiety, depression. no suicidal ideation, hallucinations.  Endo: Negative for unusual weight change.  Heme: Negative for bruising or bleeding. Allergy: Negative for rash or hives.    Physical  Examination:  BP (!) 135/80   Pulse 63   Temp 97.8 F (36.6 C)   Ht 5\' 6"  (1.676 m)   Wt (!) 216 lb 9.6 oz (98.2 kg)   BMI 34.96 kg/m    General: Well-nourished, well-developed in no acute distress.  Head: Normocephalic, atraumatic.   Eyes: Conjunctiva pink, no icterus. Mouth:masked Neck: Supple without thyromegaly, masses, or lymphadenopathy.  Lungs: Clear to auscultation bilaterally.  Heart: Regular rate and rhythm, no murmurs rubs or gallops.  Abdomen: Bowel sounds are normal, nontender, nondistended, no hepatosplenomegaly or masses, no abdominal bruits or    hernia , no rebound or guarding.   Rectal: not performed Extremities: No lower extremity edema. No clubbing or deformities.  Neuro: Alert and oriented x 4 , grossly normal neurologically.  Skin: Warm and dry, no rash or jaundice.   Psych: Alert and cooperative, normal mood and affect.  Labs: Lab Results  Component Value Date   CREATININE 0.70 03/16/2019   BUN 16 03/16/2019   NA 140 03/16/2019   K 3.9 03/16/2019   CL 107 03/16/2019   CO2 26 03/16/2019   Lab Results  Component Value Date   WBC 7.0 02/01/2019   HGB 13.9 02/01/2019   HCT 40.3 02/01/2019   MCV 81.6 02/01/2019   PLT 269 02/01/2019   Lab Results  Component Value Date   ALT 12 03/16/2019   AST 16 03/16/2019   ALKPHOS 74 08/23/2017   BILITOT 1.2 03/16/2019     Imaging Studies: No results found.  Impression/Plan:   64 y/o female presenting for follow up of GERD, gastritis, fatty liver, h/o colon polyps.   GERD: symptoms well controlled on pantoprazole once daily. Continue current regimen. Reinforced antireflux measures. Continue to strive for weight reduction.   Fatty liver: LFTs have been normal. Encouraged weight reduction and physical activity as tolerated.   H/O numerous adenomatous colon polyps with last colonoscopy three years ago. Due for surveillance at this time. H/O of advanced polyp remotely. Colonoscopy with Dr. Abbey Chatters in near  future, will schedule pending determination if  she will need surgery for parathyroid issues. Once cleared, then pursue colonoscopy. ASA II.  I have discussed the risks, alternatives, benefits with regards to but not limited to the risk of reaction to medication, bleeding, infection, perforation and the patient is agreeable to proceed. Written consent to be obtained.

## 2020-01-02 LAB — MAGNESIUM: Magnesium: 2 mg/dL (ref 1.5–2.5)

## 2020-01-02 LAB — PTH, INTACT AND CALCIUM
Calcium: 10.9 mg/dL — ABNORMAL HIGH (ref 8.6–10.4)
PTH: 136 pg/mL — ABNORMAL HIGH (ref 14–64)

## 2020-01-02 LAB — PHOSPHORUS: Phosphorus: 2.5 mg/dL (ref 2.1–4.3)

## 2020-01-09 ENCOUNTER — Encounter: Payer: Self-pay | Admitting: "Endocrinology

## 2020-01-09 ENCOUNTER — Ambulatory Visit (INDEPENDENT_AMBULATORY_CARE_PROVIDER_SITE_OTHER): Payer: Medicare (Managed Care) | Admitting: "Endocrinology

## 2020-01-09 ENCOUNTER — Other Ambulatory Visit: Payer: Self-pay

## 2020-01-09 ENCOUNTER — Telehealth: Payer: Self-pay

## 2020-01-09 VITALS — BP 118/80 | HR 60 | Ht 66.0 in | Wt 214.6 lb

## 2020-01-09 DIAGNOSIS — E213 Hyperparathyroidism, unspecified: Secondary | ICD-10-CM

## 2020-01-09 DIAGNOSIS — E559 Vitamin D deficiency, unspecified: Secondary | ICD-10-CM

## 2020-01-09 DIAGNOSIS — M816 Localized osteoporosis [Lequesne]: Secondary | ICD-10-CM

## 2020-01-09 NOTE — Telephone Encounter (Addendum)
Pt called office, she wanted Neil Crouch PA to know she seen Dr. Dorris Fetch. She doesn't need thyroid surgery and ok to have colonoscopy. [phone]

## 2020-01-09 NOTE — Progress Notes (Signed)
01/09/2020  Endocrinology follow-up note   Ruth Ruth Hughes is a 64 y.o.-year-old female, referred by her PCP, Dr. Talbert Cage- Earma Reading.  she is here to follow-up for hypercalcemia/hyperparathyroidism.   Past Medical History:  Diagnosis Date  . Anxiety   . Cancer [facility]) 2005   polyps  . Degenerative joint disease    s/p right THA; left THA planned for 03/2012  . Depression   . GERD (gastroesophageal reflux disease)    Hiatal hernia  . Goiter   . 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 Hx of adenomatous colonic polyps   . Hyperlipidemia    H/o PVCs  . Hypertension   . PONV (postoperative nausea and vomiting)   . Spinal stenosis    CT in 07/2010: L3-4; degenerative joint disease at L4-5    Past Surgical History:  Procedure Laterality Date  . CHOLECYSTECTOMY N/A 08/27/2017   Procedure: LAPAROSCOPIC CHOLECYSTECTOMY;  Surgeon: Aviva Signs, MD;  Location: [facility];  Service: General;  Laterality: N/A;  . COLONOSCOPY  01/07/2001   hyperplastic polyp/tubulovillous adenoma with severe atypia  . COLONOSCOPY     + ileoscopy: per patient has additional one in 2006, 2007 and Flex sig in 2008. Told next colonoscopy in 2013.   Marland Kitchen COLONOSCOPY  09/03/11   internal hemrrhoids/diverticulosis in the left colon/ TUBULAR ADENOMA of descending colon. random colon bx negative for microscopic colitis. next TCS 08/2016  . COLONOSCOPY N/A 02/25/2017   Dr. Oneida Alar: Three 2-3 mm polyps at splenic flexure, hepatic flexure, and ascending colon, three sessile polyps in distal sigmoid and ascending colon (4-5 mm), few small-mouthed diverticula in rect-sigmoid and sigmoid, external hemorrhoids. 3 year surveillance. (simple adenomas and benign polypoid lesions)  . COLONOSCOPY W/ POLYPECTOMY  12/14/2003   Dr. Patel-->1.5 cm size polyp with patchy whitish mucosa in sigmoid colon around base of polyp. Path-adenomatous polyp  . cubital tunnel repair Bilateral   . ESOPHAGOGASTRODUODENOSCOPY  2005   hiatal  hernia, GERD per patient  . ESOPHAGOGASTRODUODENOSCOPY  09/2000   Dr. Betti Cruz, minimal gastritis, bx no hpylori  . ESOPHAGOGASTRODUODENOSCOPY  09/03/11   small bowel bx negative for celiac, mild chronic inactive gastritis, no h.pylori.   . ESOPHAGOGASTRODUODENOSCOPY N/A 08/08/2013   Dr. Oneida Alar: gastritis, small hiatal hernia, multiple small sessile polyps  . KNEE SURGERY  03/04   right  . lipoma removal Right    arm  . POLYPECTOMY  02/25/2017   Procedure: POLYPECTOMY;  Surgeon: Danie Binder, MD;  Location: [facility];  Service: Endoscopy;;  ascending colonx3; hepatic flexure; splenic flexure  . TOTAL HIP ARTHROPLASTY  2001   Right  . TOTAL HIP ARTHROPLASTY  2013   right  . TOTAL HIP ARTHROPLASTY  08/2012   left    Social History   Tobacco Use  . Smoking status: Never Smoker  . Smokeless tobacco: Never Used  . Tobacco comment: Never smoked  Vaping Use  . Vaping Use: Never used  Substance Use Topics  . Alcohol use: No    Alcohol/week: 0.0 standard drinks  . Drug use: No    Outpatient Encounter Medications as of 01/09/2020  Medication Sig  . cetirizine (ZYRTEC) 10 MG tablet Take 10 mg by mouth daily.  . Cholecalciferol (VITAMIN D3) 2000 UNITS TABS Take 2,000 Units by mouth at bedtime.   Marland Kitchen Corn Dextrin (EQL FIBER SUPPLEMENT PO) Take by mouth 2 (two) times daily as needed.  . Ferrous Gluconate 324 (37.5 Fe) MG TABS Take  1 tablet by mouth every evening.  Marland Kitchen ibuprofen (ADVIL) 200 MG tablet Take 200 mg by mouth every 6 (six) hours as needed.  . Omega-3 Fatty Acids (OMEGA-3 FISH OIL PO) Take 1,000 mg by mouth daily.  . pantoprazole (PROTONIX) 40 MG tablet TAKE 1 TABLET BY MOUTH ONCE DAILY TAKE 30 MINUTES BEFORE BREAKFAST  . Probiotic Product (ADVANCED PROBIOTIC PO) Take 1 capsule by mouth daily.  . sertraline (ZOLOFT) 50 MG tablet Take 50 mg by mouth at bedtime.   . simvastatin (ZOCOR) 20 MG tablet Take 20 mg by mouth daily at 6 PM.   . triamcinolone cream (KENALOG) 0.1 % Apply  1 application topically 2 (two) times daily as needed (for rash).   No facility-administered encounter medications on file as of 01/09/2020.    Allergies  Allergen Reactions  . Bextra [Valdecoxib] Nausea Only  . Tramadol Nausea Only     HPI  [name] was diagnosed with hypercalcemia prior to her 58th birthday, however did not require any intervention.  -Her prior work-up did not conclusively confirm primary hyperparathyroidism to require intervention.    She is currently on observation only.  Her previsit labs show hypercalcemia of 10.9, slightly improving. Her PTH is still high at 136, increasing from 50. Her vitamin D is replete at 100. Her 24-hour urine calcium is low at 41.  She has no new complaints today. 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  After taking it for 2-3 weeks she did not tolerate, it caused oral lesions, and she does not want to restart.  She also expresses her dislike for Prolia nor IV bisphosphonates at this time.     She remains on vitamin D3 2000 units daily.   I reviewed pt's last DEXA scans:  Date L1-L4 T score FN T score 33% distal Radius  Oct 04, 2017 -1.7  -3.6        -Her  bone density reveals osteoporosis of the distal one third of her forearm. No prior history of fragility fractures or falls. No history of  kidney stones. She is due for another bone density, would like to do it at her PMD office in [place]   No history of CKD. Last BUN/Cr: Lab Results  Component Value Date   BUN 16 03/16/2019   CREATININE 0.70 03/16/2019    she is not on HCTZ or other thiazide therapy.   she is not on calcium supplements,  she eats dairy and green, leafy, vegetables on average amounts.  she not a good historian, however reports a family history of hypercalcemia requiring neck surgery in 1 of her brothers in his 70s.   -Denies family history of pituitary, thyroid, pancreatic dysfunctions. 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   ROS: Limited as above.   PE: BP 118/80   Pulse 60   Ht 5\' 6"  (1.676 m)   Wt 214 lb 9.6 oz (97.3 kg)   BMI 34.64 kg/m  Wt Readings from Last 3 Encounters:  01/09/20 214 lb 9.6 oz (97.3 kg)  01/01/20 (!) 216 lb 9.6 oz (98.2 kg)  12/12/19 215 lb 3.2 oz (97.6 kg)     CMP ( most recent) CMP   Lab Results  Component Value Date   TSH 1.69 07/11/2011    Recent Results (from the past 2160 hour(s))  PTH, Intact and Calcium  Status: Abnormal   Collection Time: 12/05/19  9:26 AM  Result Value Ref Range   PTH 50 14 - 64 pg/mL    Comment: . Interpretive Guide    Intact PTH           Calcium ------------------    ----------           ------- Normal Parathyroid    Normal               Normal Hypoparathyroidism    Low or Low Normal    Low Hyperparathyroidism    Primary            Normal or High       High    Secondary          High                 Normal or Low    Tertiary           High                 High Non-Parathyroid    Hypercalcemia      Low or Low Normal    High .    Calcium 11.1 (H) 8.6 - 10.4 mg/dL  VITAMIN D 25 Hydroxy (Vit-D Deficiency, Fractures)     Status: None   Collection Time: 12/05/19  9:26 AM  Result Value Ref Range   Vit D, 25-Hydroxy 32 30 - 100 ng/mL    Comment: Vitamin D Status         25-OH Vitamin D: . Deficiency:                    <20 ng/mL Insufficiency:             20 - 29 ng/mL Optimal:                 > or = 30 ng/mL . For 25-OH Vitamin D testing on patients on  D2-supplementation and patients for whom quantitation  of D2 and D3 fractions is required, the [employer](TM) 25-OH VIT D, (D2,D3), LC/MS/MS is recommended: order  code [id number] (patients >10yrs). See Note 1 . Note 1 . For additional information, please refer to  http://education.[facility].com/faq/FAQ199  (This link is being provided for  informational/ educational purposes only.)   Calcium, urine, 24 hour     Status: None   Collection Time: 12/15/19  8:36 AM  Result Value Ref Range   Calcium, 24H Urine 41 mg/24 h    Comment:                           Reference Range  35-250                            Low calcium diet 35-200   Creatinine, urine, 24 hour     Status: None   Collection Time: 12/15/19  8:36 AM  Result Value Ref Range   Creatinine, 24H Ur 0.80 0.50 - 2.15 g/24 h  PTH, intact and calcium     Status: Abnormal   Collection Time: 01/01/20 12:00 AM  Result Value Ref Range   PTH 136 (H) 14 - 64 pg/mL    Comment: . Interpretive Guide    Intact PTH           Calcium ------------------    ----------           -------  Normal Parathyroid    Normal               Normal Hypoparathyroidism    Low or Low Normal    Low Hyperparathyroidism    Primary            Normal or High       High    Secondary          High                 Normal or Low    Tertiary           High                 High Non-Parathyroid    Hypercalcemia      Low or Low Normal    High .    Calcium 10.9 (H) 8.6 - 10.4 mg/dL  Magnesium     Status: None   Collection Time: 01/01/20 12:00 AM  Result Value Ref Range   Magnesium 2.0 1.5 - 2.5 mg/dL  Phosphorus     Status: None   Collection Time: 01/01/20 12:00 AM  Result Value Ref Range   Phosphorus 2.5 2.1 - 4.3 mg/dL    Assessment: 1. Hypercalcemia / Hyperparathyroidism  Plan: -Her previsit labs indicate persistent hypercalcemia of 10.9 mg per DL, slightly improving from 11.1% during her last visit. Her PTH remains high at 136. Her 24-hour urine calcium is 41, previously 66.     -Her work-up so far  is not conclusive although she has osteoporosis mainly on distal one third of her forearm.  This may still be early mild primary hyperpara.  -She will be kept on observation only.  If she is found to have significant hypercalcemia or hypercalciuria, she will be considered for surgical consult.    -Regarding her osteoporosis,  She did not tolerate alendronate, and does not want to be treated with Prolia nor IV Reclast for now. She will need repeat bone density before next visit.  -She gave a history consistent with neck surgery for calcium problem in 1 of her brothers in his 60s.  No history of fragility fractures. No abdominal pain, no major mood disorders, no bone pain. -She will be continued on vitamin D 3 2000 units daily and avoid any calcium supplements.    - Return in about 6 months (around 07/11/2020) for F/U with Pre-visit Labs.       - Time spent on this patient care encounter:  20 minutes of which 50% was spent in  counseling and the rest reviewing  her current and  previous labs / studies and medications  doses and developing a plan for long term care. Haskel Schroeder  participated in the discussions, expressed understanding, and voiced agreement with the above plans.  All questions were answered to her satisfaction. she is encouraged to contact clinic should she have any questions or concerns prior to her return visit.    Glade Lloyd, MD [facility] [address] [place], [place] [address] Phone: [phone]  Fax: [phone]    This note was partially dictated with voice recognition software. Similar sounding words can be transcribed inadequately or may not  be corrected upon review.  01/09/2020, 12:24 PM

## 2020-01-11 NOTE — Telephone Encounter (Signed)
Tried to call pt, no answer, LMOVM for return call.  

## 2020-01-11 NOTE — Telephone Encounter (Signed)
Please schedule colonoscopy with Dr. Abbey Chatters, ASA II. H/O adenomatous colon polyps due surveillance.   Hold iron 7 days.

## 2020-01-12 NOTE — Telephone Encounter (Signed)
Tried to call pt, no answer, LMOVM for return call.  

## 2020-01-15 ENCOUNTER — Other Ambulatory Visit (HOSPITAL_COMMUNITY): Payer: Self-pay | Admitting: Family Medicine

## 2020-01-15 DIAGNOSIS — M810 Age-related osteoporosis without current pathological fracture: Secondary | ICD-10-CM

## 2020-01-15 NOTE — Telephone Encounter (Signed)
Letter mailed

## 2020-01-16 ENCOUNTER — Other Ambulatory Visit: Payer: Self-pay

## 2020-01-16 NOTE — Telephone Encounter (Signed)
Received fax from [facility], no PA required for TCS.

## 2020-01-16 NOTE — Telephone Encounter (Signed)
Called pt, TCS scheduled with Dr. Abbey Chatters 01/29/20 at 2:00pm. COVID test 01/26/20 at 10:15am. Pt aware to hold Iron for 7 days. Orders entered. Appt letter mailed with procedure instructions.  PA for TCS faxed to [facility].

## 2020-01-22 ENCOUNTER — Other Ambulatory Visit: Payer: Self-pay

## 2020-01-22 ENCOUNTER — Ambulatory Visit (HOSPITAL_COMMUNITY)
Admission: RE | Admit: 2020-01-22 | Discharge: 2020-01-22 | Disposition: A | Discharge status: Home / Self Care | Payer: Medicare (Managed Care) | Source: Ambulatory Visit | Attending: Family Medicine | Admitting: Family Medicine

## 2020-01-22 DIAGNOSIS — M810 Age-related osteoporosis without current pathological fracture: Secondary | ICD-10-CM | POA: Diagnosis not present

## 2020-01-23 ENCOUNTER — Encounter (HOSPITAL_COMMUNITY)
Admission: RE | Admit: 2020-01-23 | Discharge: 2020-01-23 | Disposition: A | Discharge status: Home / Self Care | Payer: Medicare (Managed Care) | Source: Ambulatory Visit | Attending: Internal Medicine | Admitting: Internal Medicine

## 2020-01-26 ENCOUNTER — Other Ambulatory Visit (HOSPITAL_COMMUNITY)
Admission: RE | Admit: 2020-01-26 | Discharge: 2020-01-26 | Disposition: A | Discharge status: Home / Self Care | Payer: Medicare (Managed Care) | Source: Ambulatory Visit | Attending: Internal Medicine | Admitting: Internal Medicine

## 2020-01-26 ENCOUNTER — Other Ambulatory Visit: Payer: Self-pay

## 2020-01-26 DIAGNOSIS — Z20822 Contact with and (suspected) exposure to covid-19: Secondary | ICD-10-CM | POA: Diagnosis not present

## 2020-01-26 DIAGNOSIS — Z01812 Encounter for preprocedural laboratory examination: Secondary | ICD-10-CM | POA: Insufficient documentation

## 2020-01-26 LAB — SARS CORONAVIRUS 2 (TAT 6-24 HRS): SARS Coronavirus 2: NEGATIVE

## 2020-01-29 ENCOUNTER — Other Ambulatory Visit: Payer: Self-pay

## 2020-01-29 ENCOUNTER — Ambulatory Visit (HOSPITAL_COMMUNITY): Payer: Medicare (Managed Care) | Admitting: Anesthesiology

## 2020-01-29 ENCOUNTER — Encounter (HOSPITAL_COMMUNITY): Payer: Self-pay | Admitting: Gastroenterology

## 2020-01-29 ENCOUNTER — Encounter (HOSPITAL_COMMUNITY): Payer: Self-pay

## 2020-01-29 ENCOUNTER — Ambulatory Visit (HOSPITAL_COMMUNITY): Admit: 2020-01-29 | Payer: Medicare (Managed Care)

## 2020-01-29 ENCOUNTER — Encounter (HOSPITAL_COMMUNITY)
Admission: RE | Disposition: A | Discharge status: Home / Self Care | Payer: Self-pay | Source: Home / Self Care | Attending: Gastroenterology

## 2020-01-29 ENCOUNTER — Ambulatory Visit (HOSPITAL_COMMUNITY)
Admission: RE | Admit: 2020-01-29 | Discharge: 2020-01-29 | Disposition: A | Discharge status: Home / Self Care | Payer: Medicare (Managed Care) | Attending: Gastroenterology | Admitting: Gastroenterology

## 2020-01-29 DIAGNOSIS — I10 Essential (primary) hypertension: Secondary | ICD-10-CM | POA: Insufficient documentation

## 2020-01-29 DIAGNOSIS — Z09 Encounter for follow-up examination after completed treatment for conditions other than malignant neoplasm: Secondary | ICD-10-CM

## 2020-01-29 DIAGNOSIS — K644 Residual hemorrhoidal skin tags: Secondary | ICD-10-CM | POA: Diagnosis not present

## 2020-01-29 DIAGNOSIS — Z1211 Encounter for screening for malignant neoplasm of colon: Secondary | ICD-10-CM | POA: Insufficient documentation

## 2020-01-29 DIAGNOSIS — Z86010 Personal history of colonic polyps: Secondary | ICD-10-CM

## 2020-01-29 DIAGNOSIS — E785 Hyperlipidemia, unspecified: Secondary | ICD-10-CM | POA: Insufficient documentation

## 2020-01-29 DIAGNOSIS — F329 Major depressive disorder, single episode, unspecified: Secondary | ICD-10-CM | POA: Diagnosis not present

## 2020-01-29 DIAGNOSIS — D122 Benign neoplasm of ascending colon: Secondary | ICD-10-CM

## 2020-01-29 DIAGNOSIS — Z79899 Other long term (current) drug therapy: Secondary | ICD-10-CM | POA: Insufficient documentation

## 2020-01-29 DIAGNOSIS — F419 Anxiety disorder, unspecified: Secondary | ICD-10-CM | POA: Insufficient documentation

## 2020-01-29 DIAGNOSIS — K219 Gastro-esophageal reflux disease without esophagitis: Secondary | ICD-10-CM | POA: Diagnosis not present

## 2020-01-29 DIAGNOSIS — Z96643 Presence of artificial hip joint, bilateral: Secondary | ICD-10-CM | POA: Diagnosis not present

## 2020-01-29 DIAGNOSIS — K648 Other hemorrhoids: Secondary | ICD-10-CM | POA: Diagnosis not present

## 2020-01-29 HISTORY — PX: COLONOSCOPY WITH PROPOFOL: SHX5780

## 2020-01-29 HISTORY — PX: POLYPECTOMY: SHX5525

## 2020-01-29 SURGERY — COLONOSCOPY WITH PROPOFOL
Anesthesia: General

## 2020-01-29 SURGERY — COLONOSCOPY WITH PROPOFOL
Anesthesia: Monitor Anesthesia Care

## 2020-01-29 MED ORDER — PROPOFOL 10 MG/ML IV BOLUS
INTRAVENOUS | Status: DC | PRN
  Administered 2020-01-29: 100 mg via INTRAVENOUS

## 2020-01-29 MED ORDER — CHLORHEXIDINE GLUCONATE CLOTH 2 % EX PADS: 6.0000 | MEDICATED_PAD | Freq: Once | CUTANEOUS | Status: DC

## 2020-01-29 MED ORDER — PROPOFOL 10 MG/ML IV BOLUS
INTRAVENOUS | Status: AC
  Filled 2020-01-29: qty 40

## 2020-01-29 MED ORDER — STERILE WATER FOR IRRIGATION IR SOLN
Status: DC | PRN
  Administered 2020-01-29: 1.5 mL

## 2020-01-29 MED ORDER — PROPOFOL 500 MG/50ML IV EMUL
INTRAVENOUS | Status: DC | PRN
  Administered 2020-01-29: 150 ug/kg/min via INTRAVENOUS

## 2020-01-29 MED ORDER — LACTATED RINGERS IV SOLN: INTRAVENOUS | Status: DC | PRN

## 2020-01-29 MED ORDER — LACTATED RINGERS IV SOLN: Freq: Once | INTRAVENOUS | Status: AC

## 2020-01-29 NOTE — Anesthesia Preprocedure Evaluation (Addendum)
Anesthesia Evaluation  Patient identified by MRN, date of birth, ID band Patient awake    Reviewed: Allergy & Precautions, NPO status , Patient's Chart, lab work & pertinent test results  History of Anesthesia Complications (+) PONV and history of anesthetic complications  Airway Mallampati: II  TM Distance: >3 FB Neck ROM: Full    Dental  (+) Dental Advisory Given, Missing   Pulmonary neg pulmonary ROS,    Pulmonary exam normal breath sounds clear to auscultation       Cardiovascular hypertension, Pt. on medications Normal cardiovascular exam Rhythm:Regular Rate:Normal     Neuro/Psych PSYCHIATRIC DISORDERS Anxiety Depression negative neurological ROS     GI/Hepatic Neg liver ROS, GERD  Medicated and Controlled,  Endo/Other  negative endocrine ROS  Renal/GU negative Renal ROS     Musculoskeletal  (+) Arthritis  (spinal stenosis),   Abdominal   Peds  Hematology negative hematology ROS (+)   Anesthesia Other Findings   Reproductive/Obstetrics                            Anesthesia Physical Anesthesia Plan  ASA: II  Anesthesia Plan: General   Post-op Pain Management:    Induction: Intravenous  PONV Risk Score and Plan: TIVA  Airway Management Planned: Nasal Cannula and Natural Airway  Additional Equipment:   Intra-op Plan:   Post-operative Plan:   Informed Consent: I have reviewed the patients History and Physical, chart, labs and discussed the procedure including the risks, benefits and alternatives for the proposed anesthesia with the patient or authorized representative who has indicated his/her understanding and acceptance.     Dental advisory given  Plan Discussed with: Surgeon and CRNA  Anesthesia Plan Comments:        Anesthesia Quick Evaluation

## 2020-01-29 NOTE — Anesthesia Postprocedure Evaluation (Signed)
Anesthesia Post Note  Patient: Ruth Hughes  Procedure(s) Performed: COLONOSCOPY WITH PROPOFOL (N/A ) POLYPECTOMY  Patient location during evaluation: Phase II Anesthesia Type: General Level of consciousness: awake, oriented, awake and alert and patient cooperative Pain management: satisfactory to patient Vital Signs Assessment: post-procedure vital signs reviewed and stable Respiratory status: spontaneous breathing, respiratory function stable and nonlabored ventilation Cardiovascular status: stable Postop Assessment: no apparent nausea or vomiting Anesthetic complications: no   No complications documented.   Last Vitals:  Vitals:   01/29/20 1242 01/29/20 1415  BP: (!) 120/91 112/73  Pulse: 78 76  Resp: 16 18  Temp: 36.8 C 36.7 C  SpO2: 99% 96%    Last Pain:  Vitals:   01/29/20 1415  TempSrc: Oral  PainSc: 0-No pain                 Anoop Hemmer

## 2020-01-29 NOTE — H&P (Signed)
Ruth Hughes is an 64 y.o. female.   Chief Complaint: History of colon polyps HPI: 64 year old female with past medical history of depression, GERD, anxiety, hyperlipidemia and hypertension, who comes to the hospital for surveillance colonoscopy.  The patient denies having any complaints.  She has not presented any melena, hematochezia, abdominal pain, nausea, vomiting, fever, chills, changes in her weight.  Her last colonoscopy was performed in 2018 was found to have 3 adenomatous polyps, for which she is due for surveillance colonoscopy.  No family history of colon cancer.  Past Medical History:  Diagnosis Date  . Anxiety   . Cancer [facility]) 2005   polyps  . Degenerative joint disease    s/p right THA; left THA planned for 03/2012  . Depression   . GERD (gastroesophageal reflux disease)    Hiatal hernia  . Goiter   . 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 Hx of adenomatous colonic polyps   . Hyperlipidemia    H/o PVCs  . Hypertension   . PONV (postoperative nausea and vomiting)   . Spinal stenosis    CT in 07/2010: L3-4; degenerative joint disease at L4-5    Past Surgical History:  Procedure Laterality Date  . CHOLECYSTECTOMY N/A 08/27/2017   Procedure: LAPAROSCOPIC CHOLECYSTECTOMY;  Surgeon: Aviva Signs, MD;  Location: [facility] ORS;  Service: General;  Laterality: N/A;  . COLONOSCOPY  01/07/2001   hyperplastic polyp/tubulovillous adenoma with severe atypia  . COLONOSCOPY     + ileoscopy: per patient has additional one in 2006, 2007 and Flex sig in 2008. Told next colonoscopy in 2013.   Marland Kitchen COLONOSCOPY  09/03/11   internal hemrrhoids/diverticulosis in the left colon/ TUBULAR ADENOMA of descending colon. random colon bx negative for microscopic colitis. next TCS 08/2016  . COLONOSCOPY N/A 02/25/2017   Dr. Oneida Alar: Three 2-3 mm polyps at splenic flexure, hepatic flexure, and ascending colon, three sessile polyps in distal sigmoid and ascending colon (4-5 mm), few small-mouthed  diverticula in rect-sigmoid and sigmoid, external hemorrhoids. 3 year surveillance. (simple adenomas and benign polypoid lesions)  . COLONOSCOPY W/ POLYPECTOMY  12/14/2003   Dr. Patel-->1.5 cm size polyp with patchy whitish mucosa in sigmoid colon around base of polyp. Path-adenomatous polyp  . cubital tunnel repair Bilateral   . ESOPHAGOGASTRODUODENOSCOPY  2005   hiatal hernia, GERD per patient  . ESOPHAGOGASTRODUODENOSCOPY  09/2000   Dr. Betti Cruz, minimal gastritis, bx no hpylori  . ESOPHAGOGASTRODUODENOSCOPY  09/03/11   small bowel bx negative for celiac, mild chronic inactive gastritis, no h.pylori.   . ESOPHAGOGASTRODUODENOSCOPY N/A 08/08/2013   Dr. Oneida Alar: gastritis, small hiatal hernia, multiple small sessile polyps  . KNEE SURGERY  03/04   right  . lipoma removal Right    arm  . POLYPECTOMY  02/25/2017   Procedure: POLYPECTOMY;  Surgeon: Danie Binder, MD;  Location: [facility] SU[facility];  Service: Endoscopy;;  ascending colonx3; hepatic flexure; splenic flexure  . TOTAL HIP ARTHROPLASTY  2001   Right  . TOTAL HIP ARTHROPLASTY  2013   right  . TOTAL HIP ARTHROPLASTY  08/2012   left    Family History  Problem Relation Age of Onset  . Hypertension Mother   . Hyperlipidemia Mother   . Stroke Mother        CAD  . Diabetes Mother   . Hypertension Father        CAD  . Diabetes Father   . Diabetes Sister   . Hypertension Brother   .  Hyperlipidemia Brother   . Colon cancer Neg Hx   . Breast cancer Neg Hx    Social History:  reports that she has never smoked. She has never used smokeless tobacco. She reports that she does not drink alcohol and does not use drugs.  Allergies:  Allergies  Allergen Reactions  . Lactose Intolerance (Gi)     Bloating and gas  . Shellfish Allergy     Upset stomach  . Bextra [Valdecoxib] Nausea Only  . Tramadol Nausea Only    Medications Prior to Admission  Medication Sig Dispense Refill  . cetirizine (ZYRTEC) 10 MG tablet Take 10 mg by mouth  daily.    . Cholecalciferol (VITAMIN D3) 2000 UNITS TABS Take 2,000 Units by mouth at bedtime.     Marland Kitchen ibuprofen (ADVIL) 200 MG tablet Take 400 mg by mouth every 8 (eight) hours as needed for moderate pain.     . Omega-3 Fatty Acids (OMEGA-3 FISH OIL PO) Take 1,000 mg by mouth daily.    . pantoprazole (PROTONIX) 40 MG tablet TAKE 1 TABLET BY MOUTH ONCE DAILY TAKE 30 MINUTES BEFORE BREAKFAST (Patient taking differently: Take 40 mg by mouth daily before breakfast. ) 90 tablet 3  . Probiotic Product (ADVANCED PROBIOTIC PO) Take 1 capsule by mouth daily.    . sertraline (ZOLOFT) 50 MG tablet Take 50 mg by mouth at bedtime.     . simvastatin (ZOCOR) 20 MG tablet Take 20 mg by mouth daily at 6 PM.     . Wheat Dextrin (BENEFIBER) POWD Take 1 Scoop by mouth 2 (two) times daily.    . Ferrous Gluconate 324 (37.5 Fe) MG TABS Take 324 mg by mouth every evening.     . triamcinolone cream (KENALOG) 0.1 % Apply 1 application topically 2 (two) times daily as needed (for rash).      No results found for this or any previous visit (from the past 48 hour(s)). No results found.  Review of Systems  Constitutional: Negative.   HENT: Negative.   Eyes: Negative.   Respiratory: Negative.   Cardiovascular: Negative.   Gastrointestinal: Negative.   Endocrine: Negative.   Genitourinary: Negative.   Musculoskeletal: Negative.   Skin: Negative.   Allergic/Immunologic: Negative.   Neurological: Negative.   Hematological: Negative.   Psychiatric/Behavioral: Negative.     Blood pressure (!) 120/91, pulse 78, temperature 98.2 F (36.8 C), temperature source Oral, resp. rate 16, height 5\' 7"  (1.702 m), SpO2 99 %. Physical Exam  GENERAL: The patient is AO x3, in no acute distress. HEENT: Head is normocephalic and atraumatic. EOMI are intact. Mouth is well hydrated and without lesions. NECK: Supple. No masses LUNGS: Clear to auscultation. No presence of rhonchi/wheezing/rales. Adequate chest expansion HEART: RRR,  normal s1 and s2. ABDOMEN: Soft, nontender, no guarding, no peritoneal signs, and nondistended. BS +. No masses. EXTREMITIES: Without any cyanosis, clubbing, rash, lesions or edema. NEUROLOGIC: AOx3, no focal motor deficit. SKIN: no jaundice, no rashes  Assessment/Plan 64 year old female with past medical history of depression, GERD, anxiety, hyperlipidemia and hypertension, who comes to the hospital for surveillance of colon polyps.  The patient Has had advanced adenomatous polyps in the past.  We will proceed with colonoscopy for surveillance.  Harvel Quale, MD 01/29/2020, 1:07 PM

## 2020-01-29 NOTE — Op Note (Signed)
[facility] Patient Name: Ruth Hughes Procedure Date: 01/29/2020 1:39 PM MRN: [id number] Date of Birth: Oct 27, 1955 Attending MD: Maylon Peppers ,  CSN: [id number] Age: 64 Admit Type: Outpatient Procedure:                Colonoscopy Indications:              High risk colon cancer surveillance: Personal                            history of colonic polyps Providers:                Maylon Peppers, Charlsie Quest. Theda Sers RN, RN, Caprice Kluver, Casimer Bilis, Technician, Aram Candela Referring MD:              Medicines:                Monitored Anesthesia Care Complications:            No immediate complications. Estimated Blood Loss:     Estimated blood loss: none. Procedure:                Pre-Anesthesia Assessment:                           - Prior to the procedure, a History and Physical                            was performed, and patient medications, allergies                            and sensitivities were reviewed. The patient's                            tolerance of previous anesthesia was reviewed.                           - The risks and benefits of the procedure and the                            sedation options and risks were discussed with the                            patient. All questions were answered and informed                            consent was obtained.                           - ASA Grade Assessment: II - A patient with mild                            systemic disease.                           After obtaining informed consent, the  colonoscope                            was passed under direct vision. Throughout the                            procedure, the patient's blood pressure, pulse, and                            oxygen saturations were monitored continuously. The                            [id number] ([id number]) was introduced through the                            anus and advanced to the the terminal ileum.  The                            colonoscopy was performed without difficulty. The                            patient tolerated the procedure well. The quality                            of the bowel preparation was excellent. Scope                            withdrawal time was 11 minutes. Scope In: 1:55:10 PM Scope Out: 2:11:11 PM Scope Withdrawal Time: 0 hours 12 minutes 55 seconds  Total Procedure Duration: 0 hours 16 minutes 1 second  Findings:      The perianal and digital rectal examinations were normal.      The terminal ileum appeared normal.      Two sessile polyps were found in the ascending colon. The polyps were 2       to 8 mm in size. These polyps were removed with a cold snare. Resection       was complete, and retrieval was complete.      A tattoo was seen in the sigmoid colon. The tattoo site appeared normal.      Non-bleeding internal hemorrhoids were found during retroflexion. The       hemorrhoids were small. Impression:               - The examined portion of the ileum was normal.                           - Two 2 to 8 mm polyps in the ascending colon,                            removed with a cold snare. Resected and retrieved.                           - A tattoo was seen in the sigmoid colon. The  tattoo site appeared normal.                           - Non-bleeding internal hemorrhoids. Moderate Sedation:      Per Anesthesia Care Recommendation:           - Discharge patient to home (ambulatory).                           - Resume previous diet.                           - Await pathology results.                           - Repeat colonoscopy in 3 years for surveillance. Procedure Code(s):        --- Professional ---                           [phone], Colonoscopy, flexible; with removal of                            tumor(s), polyp(s), or other lesion(s) by snare                            technique Diagnosis Code(s):        ---  Professional ---                           Z86.010, Personal history of colonic polyps                           K63.5, Polyp of colon                           K64.8, Other hemorrhoids CPT copyright 2019 American Medical Association. All rights reserved. The codes documented in this report are preliminary and upon coder review may  be revised to meet current compliance requirements. Maylon Peppers, MD Maylon Peppers,  01/29/2020 2:16:33 PM This report has been signed electronically. Number of Addenda: 0

## 2020-01-29 NOTE — Transfer of Care (Signed)
Immediate Anesthesia Transfer of Care Note  Patient: Ruth Hughes  Procedure(s) Performed: COLONOSCOPY WITH PROPOFOL (N/A ) POLYPECTOMY  Patient Location: PACU  Anesthesia Type:General  Level of Consciousness: awake, alert , oriented and patient cooperative  Airway & Oxygen Therapy: Patient Spontanous Breathing  Post-op Assessment: Report given to RN, Post -op Vital signs reviewed and stable and Patient moving all extremities X 4  Post vital signs: Reviewed and stable  Last Vitals:  Vitals Value Taken Time  BP    Temp    Pulse    Resp    SpO2      Last Pain:  Vitals:   01/29/20 1350  TempSrc:   PainSc: 0-No pain      Patients Stated Pain Goal: 6 (62/69/48 [id number])  Complications: No complications documented.

## 2020-01-29 NOTE — Discharge Instructions (Signed)
Colonoscopy, Adult, Care After This sheet gives you information about how to care for yourself after your procedure. Your doctor may also give you more specific instructions. If you have problems or questions, call your doctor. What can I expect after the procedure? After the procedure, it is common to have:  A small amount of blood in your poop (stool) for 24 hours.  Some gas.  Mild cramping or bloating in your belly (abdomen). Follow these instructions at home: Eating and drinking   Drink enough fluid to keep your pee (urine) pale yellow.  Follow instructions from your doctor about what you cannot eat or drink.  Return to your normal diet as told by your doctor. Avoid heavy or fried foods that are hard to digest. Activity  Rest as told by your doctor.  Do not sit for a long time without moving. Get up to take short walks every 1-2 hours. This is important. Ask for help if you feel weak or unsteady.  Return to your normal activities as told by your doctor. Ask your doctor what activities are safe for you. To help cramping and bloating:   Try walking around.  Put heat on your belly as told by your doctor. Use the heat source that your doctor recommends, such as a moist heat pack or a heating pad. ? Put a towel between your skin and the heat source. ? Leave the heat on for 20-30 minutes. ? Remove the heat if your skin turns bright red. This is very important if you are unable to feel pain, heat, or cold. You may have a greater risk of getting burned. General instructions  For the first 24 hours after the procedure: ? Do not drive or use machinery. ? Do not sign important documents. ? Do not drink alcohol. ? Do your daily activities more slowly than normal. ? Eat foods that are soft and easy to digest.  Take over-the-counter or prescription medicines only as told by your doctor.  Keep all follow-up visits as told by your doctor. This is important. Contact a doctor  if:  You have blood in your poop 2-3 days after the procedure. Get help right away if:  You have more than a small amount of blood in your poop.  You see large clumps of tissue (blood clots) in your poop.  Your belly is swollen.  You feel like you may vomit (nauseous).  You vomit.  You have a fever.  You have belly pain that gets worse, and medicine does not help your pain. Summary  After the procedure, it is common to have a small amount of blood in your poop. You may also have mild cramping and bloating in your belly.  For the first 24 hours after the procedure, do not drive or use machinery, do not sign important documents, and do not drink alcohol.  Get help right away if you have a lot of blood in your poop, feel like you may vomit, have a fever, or have more belly pain. This information is not intended to replace advice given to you by your health care provider. Make sure you discuss any questions you have with your health care provider. Document Revised: 12/12/2018 Document Reviewed: 12/12/2018 Elsevier Patient Education  [place]. Colon Polyps  Polyps are tissue growths inside the body. Polyps can grow in many places, including the large intestine (colon). A polyp may be a round bump or a mushroom-shaped growth. You could have one polyp or several. Most  colon polyps are noncancerous (benign). However, some colon polyps can become cancerous over time. Finding and removing the polyps early can help prevent this. What are the causes? The exact cause of colon polyps is not known. What increases the risk? You are more likely to develop this condition if you:  Have a family history of colon cancer or colon polyps.  Are older than 10 or older than 45 if you are African American.  Have inflammatory bowel disease, such as ulcerative colitis or Crohn's disease.  Have certain hereditary conditions, such as: ? Familial adenomatous polyposis. ? Lynch  syndrome. ? Turcot syndrome. ? Peutz-Jeghers syndrome.  Are overweight.  Smoke cigarettes.  Do not get enough exercise.  Drink too much alcohol.  Eat a diet that is high in fat and red meat and low in fiber.  Had childhood cancer that was treated with abdominal radiation. What are the signs or symptoms? Most polyps do not cause symptoms. If you have symptoms, they may include:  Blood coming from your rectum when having a bowel movement.  Blood in your stool. The stool may look dark red or black.  Abdominal pain.  A change in bowel habits, such as constipation or diarrhea. How is this diagnosed? This condition is diagnosed with a colonoscopy. This is a procedure in which a lighted, flexible scope is inserted into the anus and then passed into the colon to examine the area. Polyps are sometimes found when a colonoscopy is done as part of routine cancer screening tests. How is this treated? Treatment for this condition involves removing any polyps that are found. Most polyps can be removed during a colonoscopy. Those polyps will then be tested for cancer. Additional treatment may be needed depending on the results of testing. Follow these instructions at home: Lifestyle  Maintain a healthy weight, or lose weight if recommended by your health care provider.  Exercise every day or as told by your health care provider.  Do not use any products that contain nicotine or tobacco, such as cigarettes and e-cigarettes. If you need help quitting, ask your health care provider.  If you drink alcohol, limit how much you have: ? 0-1 drink a day for women. ? 0-2 drinks a day for men.  Be aware of how much alcohol is in your drink. In the [place] one drink equals one 12 oz bottle of beer (355 mL), one 5 oz glass of wine (148 mL), or one 1 oz shot of hard liquor (44 mL). Eating and drinking   Eat foods that are high in fiber, such as fruits, vegetables, and whole grains.  Eat foods that  are high in calcium and vitamin D, such as milk, cheese, yogurt, eggs, liver, fish, and broccoli.  Limit foods that are high in fat, such as fried foods and desserts.  Limit the amount of red meat and processed meat you eat, such as hot dogs, sausage, bacon, and lunch meats. General instructions  Keep all follow-up visits as told by your health care provider. This is important. ? This includes having regularly scheduled colonoscopies. ? Talk to your health care provider about when you need a colonoscopy. Contact a health care provider if:  You have new or worsening bleeding during a bowel movement.  You have new or increased blood in your stool.  You have a change in bowel habits.  You lose weight for no known reason. Summary  Polyps are tissue growths inside the body. Polyps can grow in many places,  including the colon.  Most colon polyps are noncancerous (benign), but some can become cancerous over time.  This condition is diagnosed with a colonoscopy.  Treatment for this condition involves removing any polyps that are found. Most polyps can be removed during a colonoscopy. This information is not intended to replace advice given to you by your health care provider. Make sure you discuss any questions you have with your health care provider. Document Revised: 09/02/2017 Document Reviewed: 09/02/2017 Elsevier Patient Education  2020 [place] are being discharged to home.  Resume your previous diet.  We are waiting for your pathology results.  Your physician has recommended a repeat colonoscopy in three years for surveillance.

## 2020-01-31 ENCOUNTER — Encounter (INDEPENDENT_AMBULATORY_CARE_PROVIDER_SITE_OTHER): Payer: Self-pay

## 2020-01-31 LAB — SURGICAL PATHOLOGY

## 2020-02-01 ENCOUNTER — Telehealth: Payer: Self-pay | Admitting: *Deleted

## 2020-02-01 MED ORDER — PANTOPRAZOLE SODIUM 40 MG PO TBEC: DELAYED_RELEASE_TABLET | ORAL | 3 refills | Status: DC

## 2020-02-01 NOTE — Telephone Encounter (Signed)
Called pt to inform her that we received request for 90 day supply of Pantoprazole.  Informed her that RX had been sent.  Pt said that she did not want that and only wants 30 day supply.  She said she is going to call her pharmacy and notify them of that.  FYI for Neil Crouch, PA.

## 2020-02-01 NOTE — Addendum Note (Signed)
Addended by: Mahala Menghini on: 02/01/2020 02:28 PM   Modules accepted: Orders

## 2020-02-01 NOTE — Telephone Encounter (Signed)
rx sent

## 2020-02-01 NOTE — Telephone Encounter (Signed)
Noted. Patient can request for 30 day instead of 90 day as written.

## 2020-02-01 NOTE — Telephone Encounter (Signed)
Received fax from [facility] p: [phone].  They are informing us that pt is currently on Pantoprazole 40 mg and receives 30 day supply but would benefit from a 90 day supply.  The RX was last filled 01/11/2020 according to fax.  Pt's preferred pharmacy is [facility].

## 2020-02-02 NOTE — Telephone Encounter (Signed)
Noted  

## 2020-02-08 ENCOUNTER — Encounter (HOSPITAL_COMMUNITY): Payer: Self-pay | Admitting: Gastroenterology

## 2020-02-21 IMAGING — US US ABDOMEN LIMITED
1 series · 14 of 25 positions shown · non-contrast
Comparison: Ultrasound August 31, 2016.

CLINICAL DATA: Right upper quadrant abdominal pain.

EXAM:
ULTRASOUND ABDOMEN LIMITED RIGHT UPPER QUADRANT

[Series 1: us abdomen limited · 0.20mm/px · 14 of 75 slices shown]
[im 1/75]
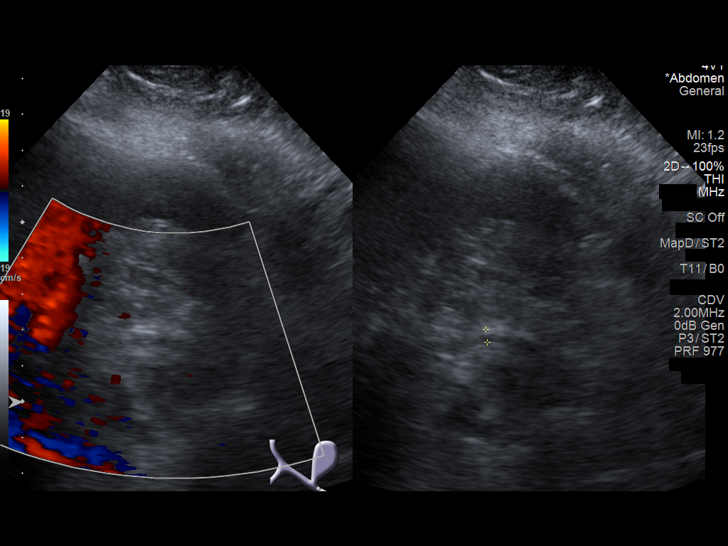
[im 7/75]
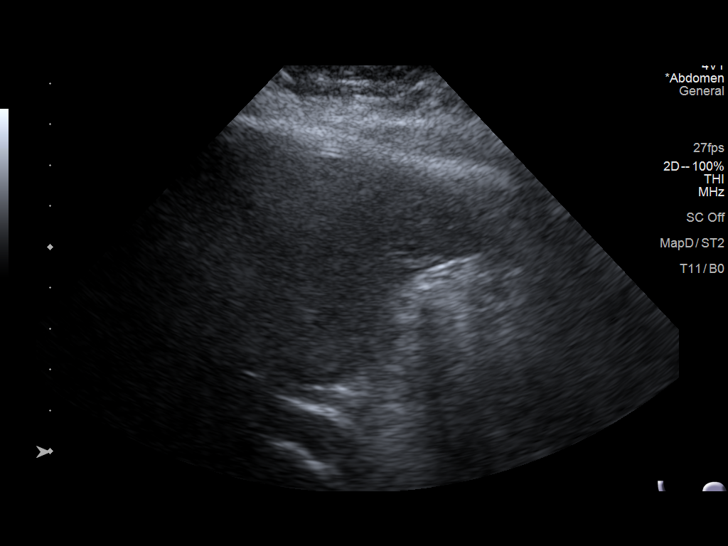
[im 13/75]
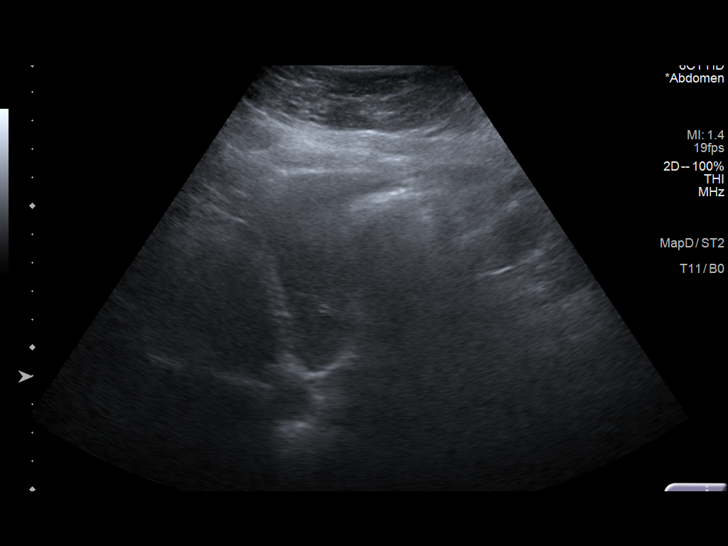
[im 19/75]
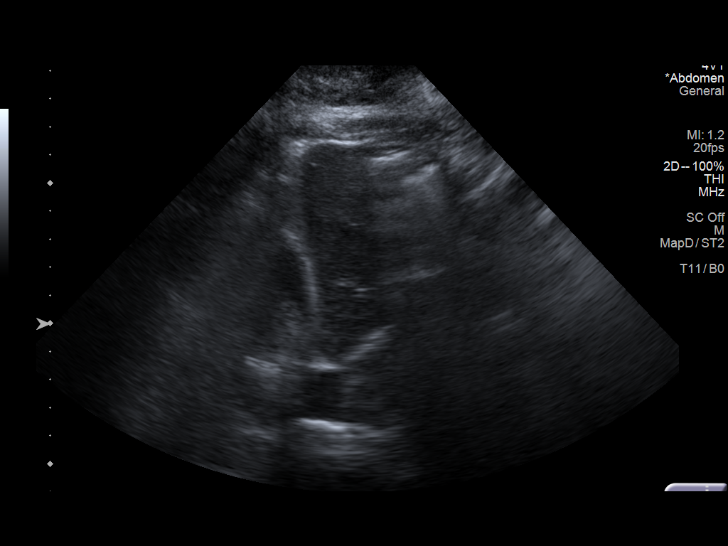
[im 25/75]
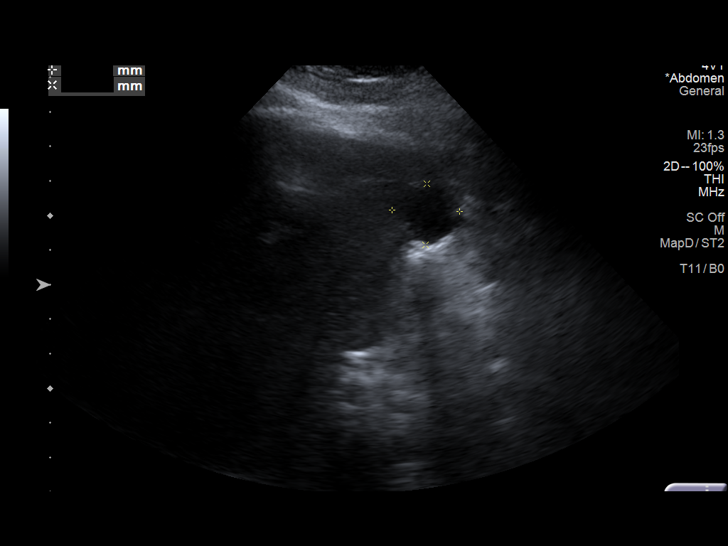
[im 28/75]
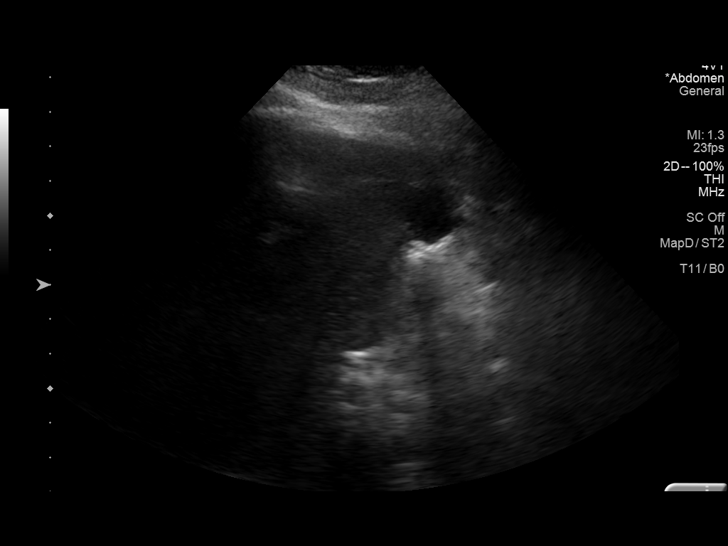
[im 34/75]
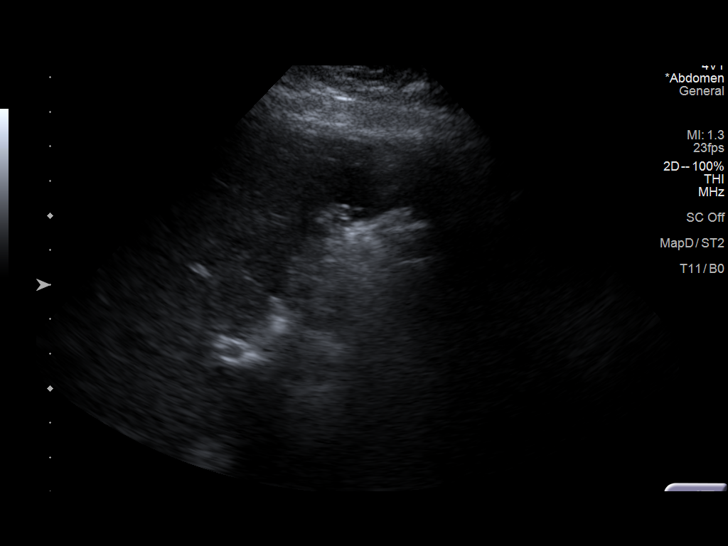
[im 41/75]
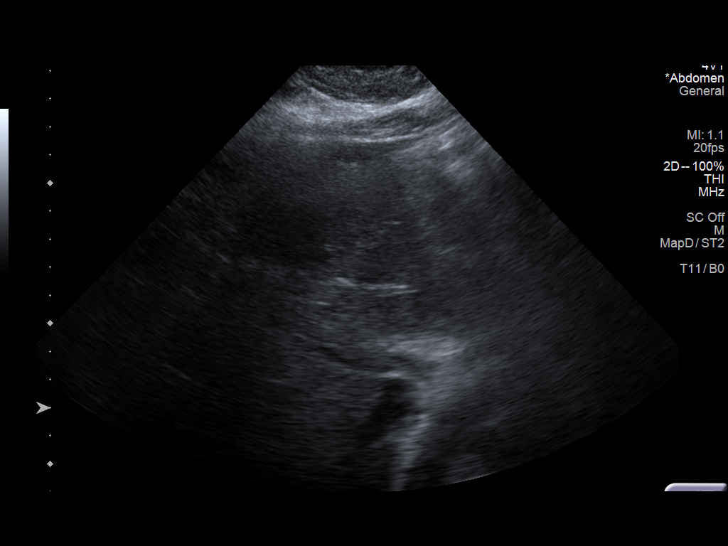
[im 47/75]
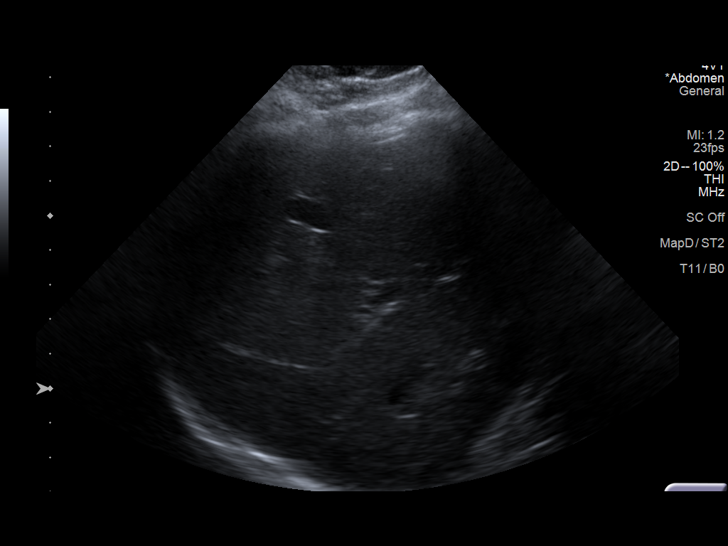
[im 50/75]
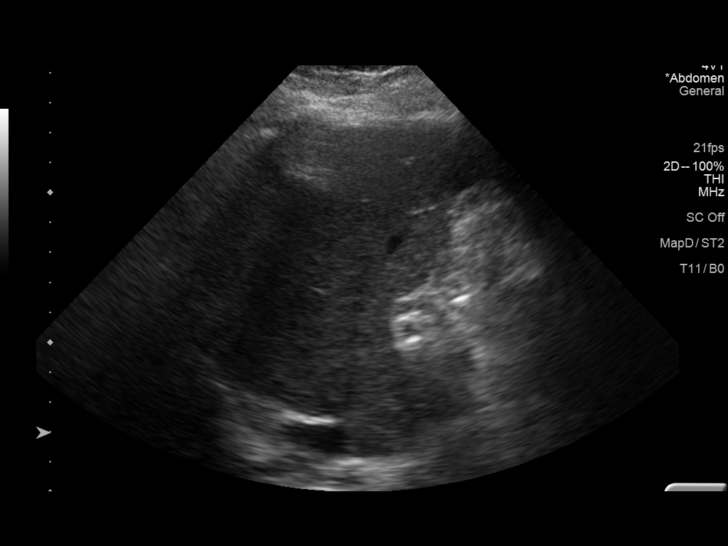
[im 56/75]
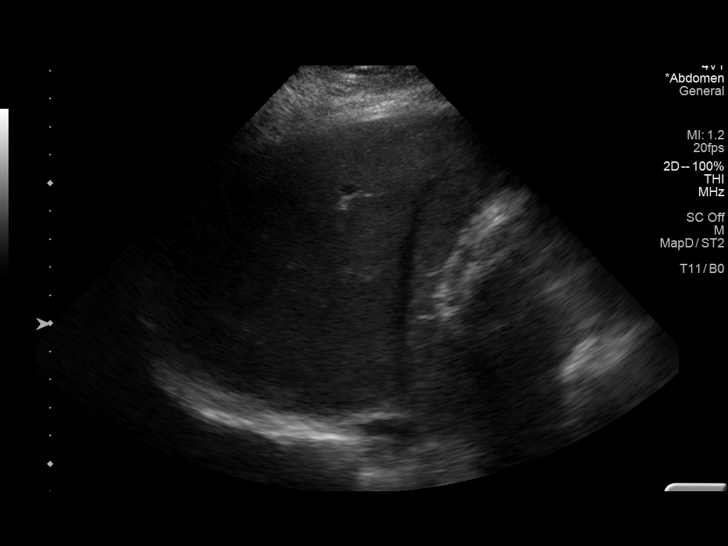
[im 62/75]
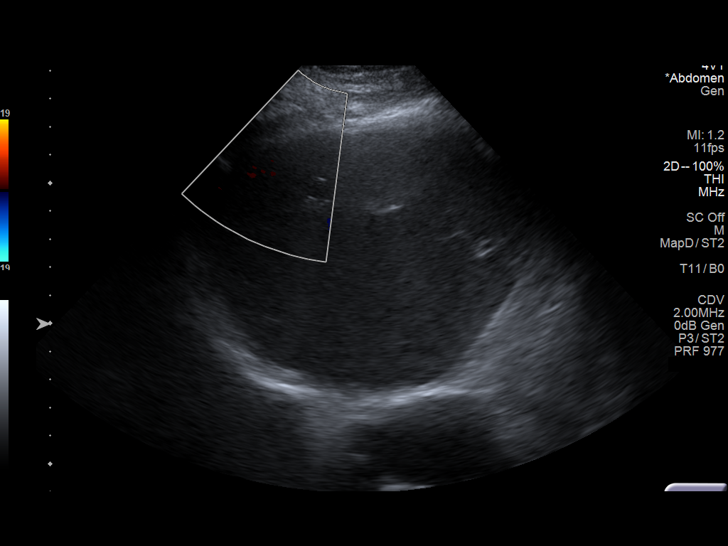
[im 68/75]
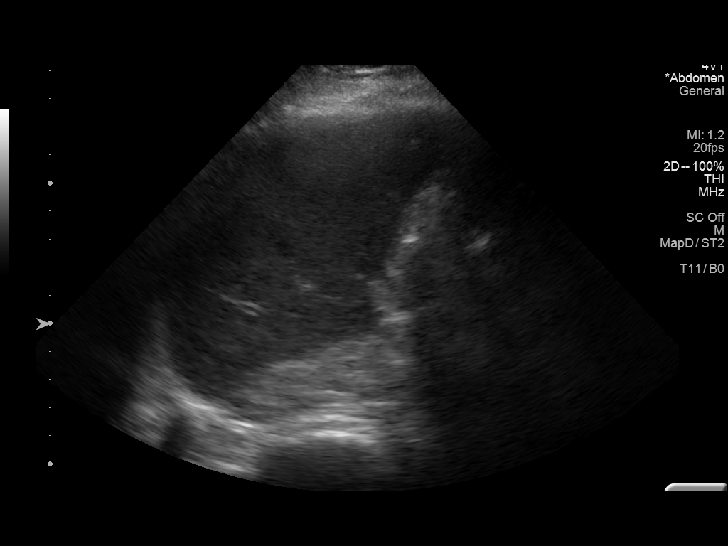
[im 75/75]
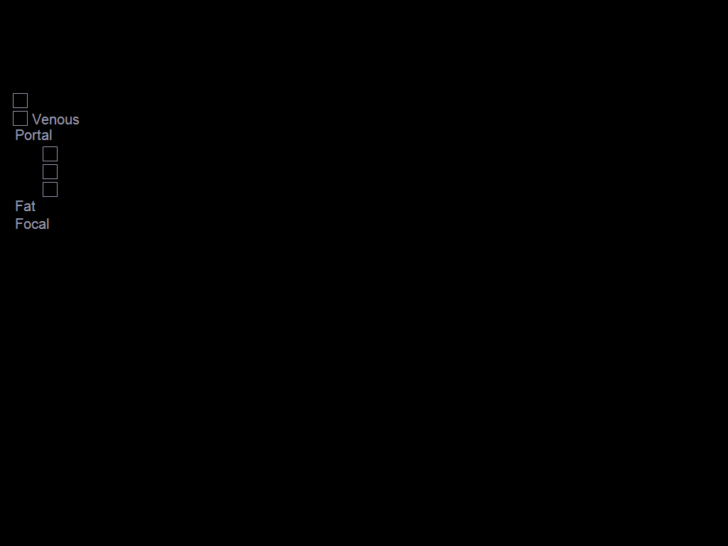

[14 of 25 positions shown; findings below may reference images not displayed]

FINDINGS: Gallbladder:

Status post cholecystectomy.

Common bile duct:

Diameter: 4 mm which is within normal limits.

Liver:

Increased echogenicity of hepatic parenchyma is noted. 2.5 cm simple
cyst is seen in left hepatic lobe. 1.7 cm simple cyst is seen in
right hepatic lobe. Portal vein is patent on color Doppler imaging
with normal direction of blood flow towards the liver.

Other: None.
IMPRESSION: Probable hepatic steatosis. Simple hepatic cysts are noted. Status
post cholecystectomy.

## 2020-06-29 LAB — PTH, INTACT AND CALCIUM
Calcium: 10.6 mg/dL — ABNORMAL HIGH (ref 8.7–10.3)
PTH: 59 pg/mL (ref 15–65)

## 2020-06-29 LAB — VITAMIN D 25 HYDROXY (VIT D DEFICIENCY, FRACTURES): Vit D, 25-Hydroxy: 33.2 ng/mL (ref 30.0–100.0)

## 2020-07-15 ENCOUNTER — Ambulatory Visit: Payer: Medicare (Managed Care) | Admitting: "Endocrinology

## 2020-07-16 ENCOUNTER — Encounter: Payer: Self-pay | Admitting: Nurse Practitioner

## 2020-07-16 ENCOUNTER — Other Ambulatory Visit: Payer: Self-pay

## 2020-07-16 ENCOUNTER — Ambulatory Visit (INDEPENDENT_AMBULATORY_CARE_PROVIDER_SITE_OTHER): Payer: Medicare (Managed Care) | Admitting: Nurse Practitioner

## 2020-07-16 VITALS — BP 133/80 | HR 59 | Ht 66.0 in | Wt 220.0 lb

## 2020-07-16 DIAGNOSIS — E559 Vitamin D deficiency, unspecified: Secondary | ICD-10-CM

## 2020-07-16 DIAGNOSIS — M816 Localized osteoporosis [Lequesne]: Secondary | ICD-10-CM | POA: Diagnosis not present

## 2020-07-16 DIAGNOSIS — E213 Hyperparathyroidism, unspecified: Secondary | ICD-10-CM

## 2020-07-16 NOTE — Patient Instructions (Signed)
Denosumab injection What is this medicine? DENOSUMAB (den oh sue mab) slows bone breakdown. Prolia is used to treat osteoporosis in women after menopause and in men, and in people who are taking corticosteroids for 6 months or more. Xgeva is used to treat a high calcium level due to cancer and to prevent bone fractures and other bone problems caused by multiple myeloma or cancer bone metastases. Xgeva is also used to treat giant cell tumor of the bone. This medicine may be used for other purposes; ask your health care provider or pharmacist if you have questions. COMMON BRAND NAME(S): Prolia, XGEVA What should I tell my health care provider before I take this medicine? They need to know if you have any of these conditions:  dental disease  having surgery or tooth extraction  infection  kidney disease  low levels of calcium or Vitamin D in the blood  malnutrition  on hemodialysis  skin conditions or sensitivity  thyroid or parathyroid disease  an unusual reaction to denosumab, other medicines, foods, dyes, or preservatives  pregnant or trying to get pregnant  breast-feeding How should I use this medicine? This medicine is for injection under the skin. It is given by a health care professional in a hospital or clinic setting. A special MedGuide will be given to you before each treatment. Be sure to read this information carefully each time. For Prolia, talk to your pediatrician regarding the use of this medicine in children. Special care may be needed. For Xgeva, talk to your pediatrician regarding the use of this medicine in children. While this drug may be prescribed for children as young as 13 years for selected conditions, precautions do apply. Overdosage: If you think you have taken too much of this medicine contact a poison control center or emergency room at once. NOTE: This medicine is only for you. Do not share this medicine with others. What if I miss a dose? It is  important not to miss your dose. Call your doctor or health care professional if you are unable to keep an appointment. What may interact with this medicine? Do not take this medicine with any of the following medications:  other medicines containing denosumab This medicine may also interact with the following medications:  medicines that lower your chance of fighting infection  steroid medicines like prednisone or cortisone This list may not describe all possible interactions. Give your health care provider a list of all the medicines, herbs, non-prescription drugs, or dietary supplements you use. Also tell them if you smoke, drink alcohol, or use illegal drugs. Some items may interact with your medicine. What should I watch for while using this medicine? Visit your doctor or health care professional for regular checks on your progress. Your doctor or health care professional may order blood tests and other tests to see how you are doing. Call your doctor or health care professional for advice if you get a fever, chills or sore throat, or other symptoms of a cold or flu. Do not treat yourself. This drug may decrease your body's ability to fight infection. Try to avoid being around people who are sick. You should make sure you get enough calcium and vitamin D while you are taking this medicine, unless your doctor tells you not to. Discuss the foods you eat and the vitamins you take with your health care professional. See your dentist regularly. Brush and floss your teeth as directed. Before you have any dental work done, tell your dentist you are   receiving this medicine. Do not become pregnant while taking this medicine or for 5 months after stopping it. Talk with your doctor or health care professional about your birth control options while taking this medicine. Women should inform their doctor if they wish to become pregnant or think they might be pregnant. There is a potential for serious side  effects to an unborn child. Talk to your health care professional or pharmacist for more information. What side effects may I notice from receiving this medicine? Side effects that you should report to your doctor or health care professional as soon as possible:  allergic reactions like skin rash, itching or hives, swelling of the face, lips, or tongue  bone pain  breathing problems  dizziness  jaw pain, especially after dental work  redness, blistering, peeling of the skin  signs and symptoms of infection like fever or chills; cough; sore throat; pain or trouble passing urine  signs of low calcium like fast heartbeat, muscle cramps or muscle pain; pain, tingling, numbness in the hands or feet; seizures  unusual bleeding or bruising  unusually weak or tired Side effects that usually do not require medical attention (report to your doctor or health care professional if they continue or are bothersome):  constipation  diarrhea  headache  joint pain  loss of appetite  muscle pain  runny nose  tiredness  upset stomach This list may not describe all possible side effects. Call your doctor for medical advice about side effects. You may report side effects to FDA at 1-800-FDA-1088. Where should I keep my medicine? This medicine is only given in a clinic, doctor's office, or other health care setting and will not be stored at home. NOTE: This sheet is a summary. It may not cover all possible information. If you have questions about this medicine, talk to your doctor, pharmacist, or health care provider.  2021 Elsevier/Gold Standard (2017-09-24 16:10:44)

## 2020-07-16 NOTE — Progress Notes (Signed)
07/16/2020  Endocrinology follow-up note  SUBJECTIVE:  Ruth Hughes is a 65 y.o.-year-old female, referred by her PCP, Dr. Talbert Cage- [place] [place].  she is here to follow-up for hypercalcemia/hyperparathyroidism.   Past Medical History:  Diagnosis Date  . Anxiety   . Cancer [facility]) 2005   polyps  . Degenerative joint disease    s/p right THA; left THA planned for 03/2012  . Depression   . GERD (gastroesophageal reflux disease)    Hiatal hernia  . Goiter   . 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 Hx of adenomatous colonic polyps   . Hyperlipidemia    H/o PVCs  . Hypertension   . PONV (postoperative nausea and vomiting)   . Spinal stenosis    CT in 07/2010: L3-4; degenerative joint disease at L4-5    Past Surgical History:  Procedure Laterality Date  . CHOLECYSTECTOMY N/A 08/27/2017   Procedure: LAPAROSCOPIC CHOLECYSTECTOMY;  Surgeon: Aviva Signs, MD;  Location: [facility];  Service: General;  Laterality: N/A;  . COLONOSCOPY  01/07/2001   hyperplastic polyp/tubulovillous adenoma with severe atypia  . COLONOSCOPY     + ileoscopy: per patient has additional one in 2006, 2007 and Flex sig in 2008. Told next colonoscopy in 2013.   Marland Kitchen COLONOSCOPY  09/03/11   internal hemrrhoids/diverticulosis in the left colon/ TUBULAR ADENOMA of descending colon. random colon bx negative for microscopic colitis. next TCS 08/2016  . COLONOSCOPY N/A 02/25/2017   Dr. Oneida Alar: Three 2-3 mm polyps at splenic flexure, hepatic flexure, and ascending colon, three sessile polyps in distal sigmoid and ascending colon (4-5 mm), few small-mouthed diverticula in rect-sigmoid and sigmoid, external hemorrhoids. 3 year surveillance. (simple adenomas and benign polypoid lesions)  . COLONOSCOPY W/ POLYPECTOMY  12/14/2003   Dr. Patel-->1.5 cm size polyp with patchy whitish mucosa in sigmoid colon around base of polyp. Path-adenomatous polyp  . COLONOSCOPY WITH PROPOFOL N/A 01/29/2020   Procedure: COLONOSCOPY  WITH PROPOFOL;  Surgeon: Harvel Quale, MD;  Location: [facility];  Service: Gastroenterology;  Laterality: N/A;  2:00pm  . cubital tunnel repair Bilateral   . ESOPHAGOGASTRODUODENOSCOPY  2005   hiatal hernia, GERD per patient  . ESOPHAGOGASTRODUODENOSCOPY  09/2000   Dr. Betti Cruz, minimal gastritis, bx no hpylori  . ESOPHAGOGASTRODUODENOSCOPY  09/03/11   small bowel bx negative for celiac, mild chronic inactive gastritis, no h.pylori.   . ESOPHAGOGASTRODUODENOSCOPY N/A 08/08/2013   Dr. Oneida Alar: gastritis, small hiatal hernia, multiple small sessile polyps  . KNEE SURGERY  03/04   right  . lipoma removal Right    arm  . POLYPECTOMY  02/25/2017   Procedure: POLYPECTOMY;  Surgeon: Danie Binder, MD;  Location: [facility];  Service: Endoscopy;;  ascending colonx3; hepatic flexure; splenic flexure  . POLYPECTOMY  01/29/2020   Procedure: POLYPECTOMY;  Surgeon: Harvel Quale, MD;  Location: [facility];  Service: Gastroenterology;;  . TOTAL HIP ARTHROPLASTY  2001   Right  . TOTAL HIP ARTHROPLASTY  2013   right  . TOTAL HIP ARTHROPLASTY  08/2012   left    Social History   Tobacco Use  . Smoking status: Never Smoker  . Smokeless tobacco: Never Used  . Tobacco comment: Never smoked  Vaping Use  . Vaping Use: Never used  Substance Use Topics  . Alcohol use: No    Alcohol/week: 0.0 standard drinks  . Drug use: No    Outpatient Encounter Medications as of 07/16/2020  Medication Sig  .  cetirizine (ZYRTEC) 10 MG tablet Take 10 mg by mouth daily.  . Cholecalciferol (VITAMIN D3) 2000 UNITS TABS Take 2,000 Units by mouth at bedtime.   . Ferrous Gluconate 324 (37.5 Fe) MG TABS Take 324 mg by mouth every evening.   Marland Kitchen ibuprofen (ADVIL) 200 MG tablet Take 400 mg by mouth every 8 (eight) hours as needed for moderate pain.   . Omega-3 Fatty Acids (OMEGA-3 FISH OIL PO) Take 1,000 mg by mouth daily.  . pantoprazole (PROTONIX) 40 MG tablet TAKE 1 TABLET BY MOUTH ONCE  DAILY TAKE 30 MINUTES BEFORE BREAKFAST  . Probiotic Product (ADVANCED PROBIOTIC PO) Take 1 capsule by mouth daily.  . sertraline (ZOLOFT) 50 MG tablet Take 50 mg by mouth at bedtime.   . simvastatin (ZOCOR) 20 MG tablet Take 20 mg by mouth daily at 6 PM.   . triamcinolone cream (KENALOG) 0.1 % Apply 1 application topically 2 (two) times daily as needed (for rash).  . Wheat Dextrin (BENEFIBER) POWD Take 1 Scoop by mouth 2 (two) times daily.   No facility-administered encounter medications on file as of 07/16/2020.    Allergies  Allergen Reactions  . Lactose Intolerance (Gi)     Bloating and gas  . Shellfish Allergy     Upset stomach  . Bextra [Valdecoxib] Nausea Only  . Tramadol Nausea Only     HPI  Ruth Hughes was diagnosed with hypercalcemia prior to her 58th birthday, however did not require any intervention.  -Her prior work-up did not conclusively confirm primary hyperparathyroidism to require intervention.    She is currently on observation only.  Her previsit labs show hypercalcemia of 10.9, slightly improving. Her PTH is still high at 136, increasing from 50. Her vitamin D is replete at 50. Her 24-hour urine calcium is low at 41.  She has no new complaints today. 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  After taking it for 2-3 weeks she did not tolerate, it caused oral lesions, and she does not want to restart.  She also expresses her dislike for Prolia nor IV bisphosphonates at this time.     She remains on vitamin D3 2000 units daily.   I reviewed pt's last DEXA scans:  Date L1-L4 T score FN T score 33% distal Radius  Oct 04, 2017 -1.7  -3.6  January 22, 2020   -3.8   -Her bone density reveals osteoporosis of the distal one third of her forearm. No prior history of fragility fractures or falls. No history of kidney stones.  No history of CKD. Last BUN/Cr: Lab Results  Component Value Date   BUN 16 03/16/2019    CREATININE 0.70 03/16/2019    she is not on HCTZ or other thiazide therapy.   she is not on calcium supplements,  she eats dairy and green, leafy, vegetables on average amounts.  she not a good historian, however reports a family history of hypercalcemia requiring neck surgery in 1 of her brothers in his 57s.   -Denies family history of pituitary, thyroid, pancreatic dysfunctions.  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   Review of systems  Constitutional: + Minimally fluctuating body weight,  current There is no height or weight on file to calculate BMI. , no fatigue, no subjective hyperthermia, no subjective hypothermia Eyes: no blurry vision, no xerophthalmia ENT: no sore throat,  no nodules palpated in throat, no dysphagia/odynophagia, no hoarseness Cardiovascular: no chest pain, no shortness of breath, no palpitations, no leg swelling Respiratory: no cough, no shortness of breath Gastrointestinal: no nausea/vomiting/diarrhea Musculoskeletal: no muscle/joint aches Skin: no rashes, no hyperemia Neurological: no tremors, no numbness, no tingling, no dizziness Psychiatric: no depression, no anxiety ----------------------------------------------------------------------------------------------------------------------------  OBJECTIVE:  There were no vitals taken for this visit. Wt Readings from Last 3 Encounters:  01/09/20 214 lb 9.6 oz (97.3 kg)  01/01/20 (!) 216 lb 9.6 oz (98.2 kg)  12/12/19 215 lb 3.2 oz (97.6 kg)   BP Readings from Last 3 Encounters:  01/29/20 112/73  01/09/20 118/80  01/01/20 (!) 135/80    Physical Exam- Limited  Constitutional:  There is no height or weight on file to calculate BMI. , not in acute distress, normal state of mind Eyes:  EOMI, no exophthalmos Neck: Supple Cardiovascular: RRR, no murmers, rubs, or gallops, no edema Respiratory: Adequate  breathing efforts, no crackles, rales, rhonchi, or wheezing Musculoskeletal: no gross deformities, strength intact in all four extremities, no gross restriction of joint movements Skin:  no rashes, no hyperemia Neurological: no tremor with outstretched hands    CMP ( most recent) CMP   Lab Results  Component Value Date   TSH 1.69 07/11/2011    Recent Results (from the past 2160 hour(s))  PTH, intact and calcium     Status: Abnormal   Collection Time: 06/28/20  9:21 AM  Result Value Ref Range   Calcium 10.6 (H) 8.7 - 10.3 mg/dL   PTH 59 15 - 65 pg/mL   PTH Interp Comment     Comment: Interpretation                 Intact PTH    Calcium                                 (pg/mL)      (mg/dL) Normal                          15 - 65     8.6 - 10.2 Primary Hyperparathyroidism         >65          >10.2 Secondary Hyperparathyroidism       >65          <10.2 Non-Parathyroid Hypercalcemia       <65          >10.2 Hypoparathyroidism                  <15          < 8.6 Non-Parathyroid Hypocalcemia    15 - 65          < 8.6   VITAMIN D 25 Hydroxy (Vit-D Deficiency, Fractures)     Status: None   Collection Time: 06/28/20  9:21 AM  Result Value Ref Range   Vit D, 25-Hydroxy 33.2 30.0 - 100.0 ng/mL    Comment: Vitamin D deficiency has been defined by the Institute of Medicine and an Endocrine Society practice guideline as a level of serum 25-OH vitamin D less than 20 ng/mL (1,2). The Endocrine Society went on to further define vitamin D insufficiency as a level between 21 and 29 ng/mL (2). 1. IOM (Institute of Medicine). 2010. Dietary reference    intakes for calcium and D. [place]: The  [employer]. 2. Holick MF, Binkley [place], Bischoff-Ferrari HA, et al.    Evaluation, treatment, and prevention of vitamin D    deficiency: an Endocrine Society clinical practice    guideline. JCEM. 2011 Jul; 96(7):1911-30.     ----------------------------------------------------------------------------------------------------------------------------   Assessment / PLAN: 1. Hypercalcemia / Hyperparathyroidism:  -Her previsit labs show improving Calcium level of 10.6 and improved PTH of 59.  Her vitamin D level is stable at 33.2.   -Her work-up so far is not conclusive although she has osteoporosis mainly on distal one third of her forearm.  This may still be early mild primary hyperparathyroidism.  -She will be kept on observation only. Will recheck PTH and Calcium levels prior to next visit in 6 months.  If she is found to have significant hypercalcemia or hypercalciuria, she will be considered for surgical consult.    -Regarding her osteoporosis, she did not tolerate alendronate, and does not want to be treated with Prolia nor IV Reclast for now. She will be re-approached at next visit for initiation of Prolia treatment given slightly worse Dexa scan results indicating worsening osteoporosis.  No history of fragility fractures. No abdominal pain, no major mood disorders, no bone pain.  -She will be continued on vitamin D 3 2000 units daily and avoid any calcium supplements.        - Time spent on this patient care encounter:  25 minutes of which 50% was spent in  counseling and the rest reviewing  her current and  previous labs / studies and medications  doses and developing a plan for long term care, and documenting this care. Haskel Schroeder  participated in the discussions, expressed understanding, and voiced agreement with the above plans.  All questions were answered to her satisfaction. she is encouraged to contact clinic should she have any questions or concerns prior to her return visit.    FOLLOW UP PLAN: - Return in about 6 months (around 01/13/2021) for Hypercalcemia, hyperparathyroidism; , Previsit labs.     Rayetta Pigg, [facility] [address] [place], [place] [address] Phone: [phone] Fax: [phone]   07/16/2020, 11:34 AM

## 2020-12-25 LAB — PTH, INTACT AND CALCIUM
Calcium: 10.9 mg/dL — ABNORMAL HIGH (ref 8.7–10.3)
PTH: 61 pg/mL (ref 15–65)

## 2021-01-06 NOTE — Patient Instructions (Signed)
Hypercalcemia Hypercalcemia is when the level of calcium in a person's blood is above normal. The body needs calcium to make bones and keep them strong. Calcium also helpsthe muscles, nerves, brain, and heart work the way they should. Most of the calcium in the body is in the bones. There is also some calcium in the blood. Hypercalcemia can happen when calcium comes out of the bones, or when the kidneys are not able to remove calcium from the blood. Hypercalcemiacan be mild or severe. What are the causes? There are many possible causes of hypercalcemia. Common causes of this condition include: Hyperparathyroidism. This is a condition in which the body produces too much parathyroid hormone. There are four parathyroid glands in your neck. These glands produce a chemical messenger (hormone) that helps the body absorb calcium from foods and helps your bones release calcium. Certain kinds of cancer. Less common causes of hypercalcemia include: Getting too much calcium or vitamin D from your diet. Kidney failure. Hyperthyroidism. Severe dehydration. Being on bed rest or being inactive for a long time. Certain medicines. Infections. What increases the risk? You are more likely to develop this condition if you: Are female. Are 60 years of age or older. Have a family history of hypercalcemia. What are the signs or symptoms? Mild hypercalcemia that starts slowly may not cause symptoms. Severe, sudden hypercalcemia is more likely to cause symptoms, such as: Being more thirsty than usual. Needing to urinate more often than usual. Abdominal pain. Nausea and vomiting. Constipation. Muscle pain, twitching, or weakness. Feeling very tired. How is this diagnosed?  Hypercalcemia is usually diagnosed with a blood test. You may also have tests to help determine what is causing this condition, such as imaging tests andmore blood tests. How is this treated? Treatment for hypercalcemia depends on the  cause. Treatment may include: Receiving fluids through an IV. Medicines that: Keep calcium levels steady after receiving fluids (loop diuretics). Keep calcium in your bones (bisphosphonates). Lower the calcium level in your blood. Surgery to remove overactive parathyroid glands. A procedure that filters your blood to correct calcium levels (hemodialysis). Follow these instructions at home:  Take over-the-counter and prescription medicines only as told by your health care provider. Follow instructions from your health care provider about eating or drinking restrictions. Drink enough fluid to keep your urine pale yellow. Stay active. Weight-bearing exercise helps to keep calcium in your bones. Follow instructions from your health care provider about what type and level of exercise is safe for you. Keep all follow-up visits as told by your health care provider. This is important. Contact a health care provider if you have: A fever. A heartbeat that is irregular or very fast. Changes in mood, memory, or personality. Get help right away if you: Have severe abdominal pain. Have chest pain. Have trouble breathing. Become very confused and sleepy. Lose consciousness. Summary Hypercalcemia is when the level of calcium in a person's blood is above normal. The body needs calcium to make bones and keep them strong. Calcium also helps the muscles, nerves, brain, and heart work the way they should. There are many possible causes of hypercalcemia, and treatment depends on the cause. Take over-the-counter and prescription medicines only as told by your health care provider. Follow instructions from your health care provider about eating or drinking restrictions. This information is not intended to replace advice given to you by your health care provider. Make sure you discuss any questions you have with your healthcare provider. Document Revised: 06/14/2018 Document   Reviewed: 02/21/2018 Elsevier  Patient Education  2022 Elsevier Inc.  

## 2021-01-07 ENCOUNTER — Other Ambulatory Visit: Payer: Self-pay

## 2021-01-07 ENCOUNTER — Encounter: Payer: Self-pay | Admitting: Nurse Practitioner

## 2021-01-07 ENCOUNTER — Ambulatory Visit (INDEPENDENT_AMBULATORY_CARE_PROVIDER_SITE_OTHER): Payer: Medicare Other | Admitting: Nurse Practitioner

## 2021-01-07 VITALS — BP 134/85 | HR 62 | Ht 66.0 in | Wt 216.0 lb

## 2021-01-07 DIAGNOSIS — E559 Vitamin D deficiency, unspecified: Secondary | ICD-10-CM | POA: Diagnosis not present

## 2021-01-07 DIAGNOSIS — E213 Hyperparathyroidism, unspecified: Secondary | ICD-10-CM | POA: Diagnosis not present

## 2021-01-07 DIAGNOSIS — M816 Localized osteoporosis [Lequesne]: Secondary | ICD-10-CM

## 2021-01-07 NOTE — Progress Notes (Signed)
01/07/2021  Endocrinology follow-up note  SUBJECTIVE:  Ruth CHALOUPKA is a 65 y.o.-year-old female, referred by her PCP, Dr. Talbert Cage- [place] [place].  she is here to follow-up for hypercalcemia/hyperparathyroidism.   Past Medical History:  Diagnosis Date   Anxiety    Cancer ([place]) 2005   polyps   Degenerative joint disease    s/p right THA; left THA planned for 03/2012   Depression    GERD (gastroesophageal reflux disease)    Hiatal hernia   Goiter    Hepatic cyst    chronic, stable on multiple images   Hx of adenomatous colonic polyps    Hyperlipidemia    H/o PVCs   Hypertension    PONV (postoperative nausea and vomiting)    Spinal stenosis    CT in 07/2010: L3-4; degenerative joint disease at L4-5    Past Surgical History:  Procedure Laterality Date   CHOLECYSTECTOMY N/A 08/27/2017   Procedure: LAPAROSCOPIC CHOLECYSTECTOMY;  Surgeon: Aviva Signs, MD;  Location: [facility];  Service: General;  Laterality: N/A;   COLONOSCOPY  01/07/2001   hyperplastic polyp/tubulovillous adenoma with severe atypia   COLONOSCOPY     + ileoscopy: per patient has additional one in 2006, 2007 and Flex sig in 2008. Told next colonoscopy in 2013.    COLONOSCOPY  09/03/11   internal hemrrhoids/diverticulosis in the left colon/ TUBULAR ADENOMA of descending colon. random colon bx negative for microscopic colitis. next TCS 08/2016   COLONOSCOPY N/A 02/25/2017   Dr. Oneida Alar: Three 2-3 mm polyps at splenic flexure, hepatic flexure, and ascending colon, three sessile polyps in distal sigmoid and ascending colon (4-5 mm), few small-mouthed diverticula in rect-sigmoid and sigmoid, external hemorrhoids. 3 year surveillance. (simple adenomas and benign polypoid lesions)   COLONOSCOPY W/ POLYPECTOMY  12/14/2003   Dr. Patel-->1.5 cm size polyp with patchy whitish mucosa in sigmoid colon around base of polyp. Path-adenomatous polyp   COLONOSCOPY WITH PROPOFOL N/A 01/29/2020   Procedure: COLONOSCOPY WITH PROPOFOL;   Surgeon: Harvel Quale, MD;  Location: [facility];  Service: Gastroenterology;  Laterality: N/A;  2:00pm   cubital tunnel repair Bilateral    ESOPHAGOGASTRODUODENOSCOPY  2005   hiatal hernia, GERD per patient   ESOPHAGOGASTRODUODENOSCOPY  09/2000   Dr. Betti Cruz, minimal gastritis, bx no hpylori   ESOPHAGOGASTRODUODENOSCOPY  09/03/11   small bowel bx negative for celiac, mild chronic inactive gastritis, no h.pylori.    ESOPHAGOGASTRODUODENOSCOPY N/A 08/08/2013   Dr. Oneida Alar: gastritis, small hiatal hernia, multiple small sessile polyps   KNEE SURGERY  03/04   right   lipoma removal Right    arm   POLYPECTOMY  02/25/2017   Procedure: POLYPECTOMY;  Surgeon: Danie Binder, MD;  Location: [facility];  Service: Endoscopy;;  ascending colonx3; hepatic flexure; splenic flexure   POLYPECTOMY  01/29/2020   Procedure: POLYPECTOMY;  Surgeon: Harvel Quale, MD;  Location: [facility];  Service: Gastroenterology;;   TOTAL HIP ARTHROPLASTY  2001   Right   TOTAL HIP ARTHROPLASTY  2013   right   TOTAL HIP ARTHROPLASTY  08/2012   left    Social History   Tobacco Use   Smoking status: Never   Smokeless tobacco: Never   Tobacco comments:    Never smoked  Vaping Use   Vaping Use: Never used  Substance Use Topics   Alcohol use: No    Alcohol/week: 0.0 standard drinks   Drug use: No    Outpatient Encounter Medications as of 01/07/2021  Medication  Sig   acetaminophen (TYLENOL) 500 MG tablet Take 500 mg by mouth every 6 (six) hours as needed.   cetirizine (ZYRTEC) 10 MG tablet Take 10 mg by mouth daily.   Cholecalciferol (VITAMIN D3) 2000 UNITS TABS Take 2,000 Units by mouth at bedtime.    Ferrous Gluconate 324 (37.5 Fe) MG TABS Take 324 mg by mouth every evening.    Omega-3 Fatty Acids (OMEGA-3 FISH OIL PO) Take 1,000 mg by mouth daily.   pantoprazole (PROTONIX) 40 MG tablet TAKE 1 TABLET BY MOUTH ONCE DAILY TAKE 30 MINUTES BEFORE BREAKFAST   Probiotic Product  (ADVANCED PROBIOTIC PO) Take 1 capsule by mouth daily.   sertraline (ZOLOFT) 50 MG tablet Take 50 mg by mouth at bedtime.    simvastatin (ZOCOR) 20 MG tablet Take 20 mg by mouth daily at 6 PM.    triamcinolone cream (KENALOG) 0.1 % Apply 1 application topically 2 (two) times daily as needed (for rash).   No facility-administered encounter medications on file as of 01/07/2021.    Allergies  Allergen Reactions   Lactose Intolerance (Gi)     Bloating and gas   Shellfish Allergy     Upset stomach   Bextra [Valdecoxib] Nausea Only   Tramadol Nausea Only     HPI  AREIL WHISENANT was diagnosed with hypercalcemia prior to her 42th birthday, however did not require any intervention.  -Her prior work-up did not conclusively confirm primary hyperparathyroidism to require intervention.    She is currently on observation only.  Her previsit labs show hypercalcemia of 10.9, stable. Her PTH is also stable at 61. Her vitamin D is repleted at 33.2.   She has no new complaints today.  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  After taking it for 2-3 weeks she did not tolerate, it caused oral lesions, and she does not want to restart.  She also expresses her dislike for Prolia nor IV bisphosphonates at this time.  She remains on vitamin D3 2000 units daily.   I reviewed pt's last DEXA scans:  Date L1-L4 T score FN T score 33% distal Radius  Oct 04, 2017 -1.7  -3.6  January 22, 2020   -3.8   -Her bone density reveals osteoporosis of the distal one third of her forearm. No prior history of fragility fractures or falls. No history of kidney stones.  No history of CKD. Last BUN/Cr: Lab Results  Component Value Date   BUN 16 03/16/2019   CREATININE 0.70 03/16/2019    she is not on HCTZ or other thiazide therapy.   she is not on calcium supplements,  she eats dairy and green, leafy, vegetables on average amounts.  she not a good historian,  however reports a family history of hypercalcemia requiring neck surgery in 1 of her brothers in his 49s.   -Denies family history of pituitary, thyroid, pancreatic dysfunctions.  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   Review of systems  Constitutional: + Minimally fluctuating body weight,  current Body mass index is 34.86 kg/m. , no fatigue, no subjective hyperthermia, no subjective hypothermia Eyes: no blurry vision, no xerophthalmia ENT: no sore throat, no nodules palpated in throat, no dysphagia/odynophagia, no hoarseness Cardiovascular: no chest pain, no shortness of breath, no palpitations, no leg swelling Respiratory: no cough, no shortness of breath Gastrointestinal: no nausea/vomiting/diarrhea Musculoskeletal: no  muscle/joint aches Skin: no rashes, no hyperemia Neurological: no tremors, no numbness, no tingling, no dizziness Psychiatric: no depression, no anxiety ----------------------------------------------------------------------------------------------------------------------------  OBJECTIVE:  BP 134/85   Pulse 62   Ht '5\' 6"'$  (1.676 m)   Wt 216 lb (98 kg)   BMI 34.86 kg/m  Wt Readings from Last 3 Encounters:  01/07/21 216 lb (98 kg)  07/16/20 220 lb (99.8 kg)  01/09/20 214 lb 9.6 oz (97.3 kg)   BP Readings from Last 3 Encounters:  01/07/21 134/85  07/16/20 133/80  01/29/20 112/73     Physical Exam- Limited  Constitutional:  Body mass index is 34.86 kg/m. , not in acute distress, normal state of mind Eyes:  EOMI, no exophthalmos Neck: Supple Cardiovascular: RRR, no murmurs, rubs, or gallops, no edema Respiratory: Adequate breathing efforts, no crackles, rales, rhonchi, or wheezing Musculoskeletal: no gross deformities, strength intact in all four extremities, no gross restriction of joint movements Skin:  no rashes, no hyperemia Neurological: no tremor with  outstretched hands    CMP ( most recent) CMP   Lab Results  Component Value Date   TSH 1.69 07/11/2011    Recent Results (from the past 2160 hour(s))  PTH, intact and calcium     Status: Abnormal   Collection Time: 12/24/20 10:36 AM  Result Value Ref Range   Calcium 10.9 (H) 8.7 - 10.3 mg/dL   PTH 61 15 - 65 pg/mL   PTH Interp Comment     Comment: Interpretation                 Intact PTH    Calcium                                 (pg/mL)      (mg/dL) Normal                          15 - 65     8.6 - 10.2 Primary Hyperparathyroidism         >65          >10.2 Secondary Hyperparathyroidism       >65          <10.2 Non-Parathyroid Hypercalcemia       <65          >10.2 Hypoparathyroidism                  <15          < 8.6 Non-Parathyroid Hypocalcemia    15 - 65          < 8.6    ----------------------------------------------------------------------------------------------------------------------------   Assessment / PLAN: 1. Hypercalcemia / Hyperparathyroidism:  -Her previsit labs show stable Calcium level of 10.9 (increasing slightly from last visit) and stable PTH of 61 (also slightly increased compared to last visit).  Her vitamin D level is stable at 33.2.  -This may still be early mild primary hyperparathyroidism.  -She will be kept on observation only. She is relocating to [place], [place] and wishes to find provider closer to home due to transportation issues (had to borrow daughters car to come to her visit today).  Will recommend she recheck PTH and Calcium levels and have follow up in 6 months either here or with a different endocrinologist near her.  If she is found to have significant hypercalcemia or hypercalciuria, she will be considered for surgical consult.    -  Regarding her osteoporosis, she did not tolerate alendronate, and does not want to be treated with Prolia nor IV Reclast for now. She will be re-approached at next visit for initiation of Prolia treatment  given slightly worse Dexa scan results indicating worsening osteoporosis.  No history of fragility fractures. No abdominal pain, no major mood disorders, no bone pain.  -She will be continued on vitamin D 3 2000 units daily and avoid any calcium supplements.  Will also recommend rechecking Vitamin D prior to next visit as well.      I spent 20 minutes in the care of the patient today including review of labs from Thyroid Function, CMP, and other relevant labs ; imaging/biopsy records (current and previous including abstractions from other facilities); face-to-face time discussing  her lab results and symptoms, medications doses, her options of short and long term treatment based on the latest standards of care / guidelines;   and documenting the encounter.  Haskel Schroeder  participated in the discussions, expressed understanding, and voiced agreement with the above plans.  All questions were answered to her satisfaction. she is encouraged to contact clinic should she have any questions or concerns prior to her return visit.    FOLLOW UP PLAN: - Return in about 6 months (around 07/10/2021), or supposed to be getting set up with endocrinologist in [place] due to transportation issues, for Previsit labs.     Rayetta Pigg, [facility] [address] [place], [place] [address] Phone: [phone] Fax: [phone]   01/07/2021, 10:30 AM

## 2021-01-13 ENCOUNTER — Ambulatory Visit: Payer: Medicare (Managed Care) | Admitting: Nurse Practitioner

## 2021-01-18 ENCOUNTER — Other Ambulatory Visit: Payer: Self-pay | Admitting: Gastroenterology

## 2021-01-20 LAB — VITAMIN D 25 HYDROXY (VIT D DEFICIENCY, FRACTURES): Vit D, 25-Hydroxy: 35

## 2021-04-21 ENCOUNTER — Telehealth: Payer: Self-pay | Admitting: "Endocrinology

## 2021-04-21 NOTE — Telephone Encounter (Signed)
Patient canceled her future appts due to her insurance. She will get a new referral from her PCP to change to a Endocrinology office in [place].

## 2021-06-24 ENCOUNTER — Telehealth: Payer: Self-pay | Admitting: "Endocrinology

## 2021-06-24 NOTE — Telephone Encounter (Signed)
Received medical release form from Elisabeth Most, FNP-C office. Faxed request to [employer].

## 2021-07-10 ENCOUNTER — Ambulatory Visit: Payer: Medicare Other | Admitting: Nurse Practitioner

## 2021-07-24 ENCOUNTER — Telehealth: Payer: Self-pay | Admitting: Nurse Practitioner

## 2021-07-24 NOTE — Telephone Encounter (Signed)
Received medical records request from Elisabeth Most FNP for last office note, most recent labs, and medication list. Sent that as requested with Patient permission.

## 2021-08-08 ENCOUNTER — Ambulatory Visit: Payer: Medicaid Other | Admitting: Gastroenterology

## 2023-02-10 ENCOUNTER — Encounter: Payer: Self-pay | Admitting: *Deleted

## 2023-02-16 ENCOUNTER — Telehealth: Payer: Self-pay | Admitting: *Deleted

## 2023-02-16 NOTE — Telephone Encounter (Signed)
Pt left vm stating she had her colonoscopy done on 12/09/22 by Dr.Pandya, GI in [place], [place]. She states she will not be filling out the questionnaire and she plans on having colonoscopy done every 3 years like she was advised to do. FYI

## 2023-02-17 NOTE — Telephone Encounter (Signed)
Questionnaire from recall, no referral needed
